# Patient Record
Sex: Female | Born: 1950 | Race: White | Hispanic: No | Marital: Married | Smoking: Never smoker
Health system: Southern US, Community
[De-identification: ages and names within clinical notes are randomized; demographics above are authoritative.]

## PROBLEM LIST (undated history)

## (undated) DIAGNOSIS — I10 Essential (primary) hypertension: Secondary | ICD-10-CM

## (undated) DIAGNOSIS — Z9221 Personal history of antineoplastic chemotherapy: Secondary | ICD-10-CM

## (undated) DIAGNOSIS — C4490 Unspecified malignant neoplasm of skin, unspecified: Secondary | ICD-10-CM

## (undated) DIAGNOSIS — T7840XA Allergy, unspecified, initial encounter: Secondary | ICD-10-CM

## (undated) DIAGNOSIS — R112 Nausea with vomiting, unspecified: Secondary | ICD-10-CM

## (undated) DIAGNOSIS — K219 Gastro-esophageal reflux disease without esophagitis: Secondary | ICD-10-CM

## (undated) DIAGNOSIS — G43909 Migraine, unspecified, not intractable, without status migrainosus: Secondary | ICD-10-CM

## (undated) DIAGNOSIS — C801 Malignant (primary) neoplasm, unspecified: Secondary | ICD-10-CM

## (undated) DIAGNOSIS — Z9889 Other specified postprocedural states: Secondary | ICD-10-CM

## (undated) DIAGNOSIS — C50919 Malignant neoplasm of unspecified site of unspecified female breast: Secondary | ICD-10-CM

## (undated) DIAGNOSIS — E785 Hyperlipidemia, unspecified: Secondary | ICD-10-CM

## (undated) HISTORY — DX: Allergy, unspecified, initial encounter: T78.40XA

## (undated) HISTORY — DX: Migraine, unspecified, not intractable, without status migrainosus: G43.909

## (undated) HISTORY — DX: Malignant (primary) neoplasm, unspecified: C80.1

## (undated) HISTORY — DX: Essential (primary) hypertension: I10

## (undated) HISTORY — DX: Hyperlipidemia, unspecified: E78.5

## (undated) HISTORY — PX: BREAST LUMPECTOMY WITH RADIOACTIVE SEED AND AXILLARY LYMPH NODE DISSECTION: SHX6656

---

## 1999-03-27 ENCOUNTER — Other Ambulatory Visit: Admission: RE | Admit: 1999-03-27 | Discharge: 1999-03-27 | Payer: Self-pay | Admitting: Radiology

## 1999-03-27 ENCOUNTER — Encounter (INDEPENDENT_AMBULATORY_CARE_PROVIDER_SITE_OTHER): Payer: Self-pay

## 2006-04-16 HISTORY — PX: MASTECTOMY MODIFIED RADICAL: SUR848

## 2007-01-15 ENCOUNTER — Ambulatory Visit: Payer: Self-pay | Admitting: Internal Medicine

## 2007-01-24 ENCOUNTER — Ambulatory Visit: Payer: Self-pay | Admitting: Oncology

## 2007-01-29 ENCOUNTER — Encounter: Admission: RE | Admit: 2007-01-29 | Discharge: 2007-01-29 | Payer: Self-pay | Admitting: Surgery

## 2007-02-03 ENCOUNTER — Ambulatory Visit (HOSPITAL_BASED_OUTPATIENT_CLINIC_OR_DEPARTMENT_OTHER): Admission: RE | Admit: 2007-02-03 | Discharge: 2007-02-04 | Payer: Self-pay | Admitting: Surgery

## 2007-02-03 ENCOUNTER — Encounter (INDEPENDENT_AMBULATORY_CARE_PROVIDER_SITE_OTHER): Payer: Self-pay | Admitting: Surgery

## 2007-02-07 ENCOUNTER — Ambulatory Visit: Payer: Self-pay | Admitting: Radiation Oncology

## 2007-02-15 ENCOUNTER — Ambulatory Visit: Payer: Self-pay | Admitting: Internal Medicine

## 2007-02-15 ENCOUNTER — Ambulatory Visit: Payer: Self-pay | Admitting: Radiation Oncology

## 2007-02-25 ENCOUNTER — Ambulatory Visit (HOSPITAL_BASED_OUTPATIENT_CLINIC_OR_DEPARTMENT_OTHER): Admission: RE | Admit: 2007-02-25 | Discharge: 2007-02-26 | Payer: Self-pay | Admitting: Surgery

## 2007-02-25 ENCOUNTER — Encounter (INDEPENDENT_AMBULATORY_CARE_PROVIDER_SITE_OTHER): Payer: Self-pay | Admitting: Surgery

## 2007-03-17 ENCOUNTER — Ambulatory Visit: Payer: Self-pay | Admitting: Radiation Oncology

## 2007-03-17 ENCOUNTER — Ambulatory Visit: Payer: Self-pay | Admitting: Internal Medicine

## 2007-04-17 ENCOUNTER — Ambulatory Visit: Payer: Self-pay | Admitting: Radiation Oncology

## 2007-04-17 ENCOUNTER — Ambulatory Visit: Payer: Self-pay | Admitting: Internal Medicine

## 2007-04-17 HISTORY — PX: COLONOSCOPY: SHX174

## 2007-05-18 ENCOUNTER — Ambulatory Visit: Payer: Self-pay | Admitting: Radiation Oncology

## 2007-05-18 ENCOUNTER — Ambulatory Visit: Payer: Self-pay | Admitting: Internal Medicine

## 2007-06-15 ENCOUNTER — Ambulatory Visit: Payer: Self-pay | Admitting: Internal Medicine

## 2007-06-15 ENCOUNTER — Ambulatory Visit: Payer: Self-pay | Admitting: Radiation Oncology

## 2007-07-16 ENCOUNTER — Ambulatory Visit: Payer: Self-pay | Admitting: Internal Medicine

## 2007-07-16 ENCOUNTER — Ambulatory Visit: Payer: Self-pay | Admitting: Radiation Oncology

## 2007-08-15 ENCOUNTER — Ambulatory Visit: Payer: Self-pay | Admitting: Radiation Oncology

## 2007-08-15 ENCOUNTER — Ambulatory Visit: Payer: Self-pay | Admitting: Internal Medicine

## 2007-09-15 ENCOUNTER — Ambulatory Visit: Payer: Self-pay | Admitting: Radiation Oncology

## 2007-09-15 ENCOUNTER — Ambulatory Visit: Payer: Self-pay | Admitting: Internal Medicine

## 2007-10-15 ENCOUNTER — Ambulatory Visit: Payer: Self-pay | Admitting: Radiation Oncology

## 2007-10-15 ENCOUNTER — Ambulatory Visit: Payer: Self-pay | Admitting: Internal Medicine

## 2007-11-15 ENCOUNTER — Ambulatory Visit: Payer: Self-pay | Admitting: Radiation Oncology

## 2007-11-15 ENCOUNTER — Ambulatory Visit: Payer: Self-pay | Admitting: Internal Medicine

## 2007-12-16 ENCOUNTER — Ambulatory Visit: Payer: Self-pay | Admitting: Internal Medicine

## 2007-12-16 ENCOUNTER — Ambulatory Visit: Payer: Self-pay | Admitting: Radiation Oncology

## 2008-01-15 ENCOUNTER — Ambulatory Visit: Payer: Self-pay | Admitting: Internal Medicine

## 2008-01-15 ENCOUNTER — Ambulatory Visit: Payer: Self-pay | Admitting: Radiation Oncology

## 2008-01-21 ENCOUNTER — Ambulatory Visit: Payer: Self-pay | Admitting: Internal Medicine

## 2008-02-15 ENCOUNTER — Ambulatory Visit: Payer: Self-pay | Admitting: Internal Medicine

## 2008-02-15 ENCOUNTER — Ambulatory Visit: Payer: Self-pay | Admitting: Radiation Oncology

## 2008-02-19 ENCOUNTER — Encounter: Admission: RE | Admit: 2008-02-19 | Discharge: 2008-02-19 | Payer: Self-pay | Admitting: Surgery

## 2008-02-25 ENCOUNTER — Encounter (INDEPENDENT_AMBULATORY_CARE_PROVIDER_SITE_OTHER): Payer: Self-pay | Admitting: Diagnostic Radiology

## 2008-02-25 ENCOUNTER — Encounter: Admission: RE | Admit: 2008-02-25 | Discharge: 2008-02-25 | Payer: Self-pay | Admitting: Surgery

## 2008-03-10 ENCOUNTER — Ambulatory Visit: Payer: Self-pay | Admitting: Unknown Physician Specialty

## 2008-03-16 ENCOUNTER — Ambulatory Visit: Payer: Self-pay | Admitting: Radiation Oncology

## 2008-04-16 ENCOUNTER — Ambulatory Visit: Payer: Self-pay | Admitting: Internal Medicine

## 2008-04-16 ENCOUNTER — Ambulatory Visit: Payer: Self-pay | Admitting: Radiation Oncology

## 2008-05-17 ENCOUNTER — Ambulatory Visit: Payer: Self-pay | Admitting: Radiation Oncology

## 2008-05-17 ENCOUNTER — Ambulatory Visit: Payer: Self-pay | Admitting: Internal Medicine

## 2008-06-14 ENCOUNTER — Ambulatory Visit: Payer: Self-pay | Admitting: Radiation Oncology

## 2008-07-15 ENCOUNTER — Ambulatory Visit: Payer: Self-pay | Admitting: Radiation Oncology

## 2008-08-14 ENCOUNTER — Ambulatory Visit: Payer: Self-pay | Admitting: Radiation Oncology

## 2008-08-19 ENCOUNTER — Encounter: Admission: RE | Admit: 2008-08-19 | Discharge: 2008-08-19 | Payer: Self-pay | Admitting: Surgery

## 2008-09-14 ENCOUNTER — Ambulatory Visit: Payer: Self-pay | Admitting: Radiation Oncology

## 2008-09-14 ENCOUNTER — Ambulatory Visit: Payer: Self-pay | Admitting: Internal Medicine

## 2008-09-23 ENCOUNTER — Ambulatory Visit (HOSPITAL_BASED_OUTPATIENT_CLINIC_OR_DEPARTMENT_OTHER): Admission: RE | Admit: 2008-09-23 | Discharge: 2008-09-23 | Payer: Self-pay | Admitting: Surgery

## 2008-12-13 ENCOUNTER — Ambulatory Visit: Payer: Self-pay | Admitting: Oncology

## 2008-12-15 ENCOUNTER — Ambulatory Visit: Payer: Self-pay | Admitting: Internal Medicine

## 2009-01-14 ENCOUNTER — Ambulatory Visit: Payer: Self-pay | Admitting: Internal Medicine

## 2009-07-15 ENCOUNTER — Ambulatory Visit: Payer: Self-pay | Admitting: Internal Medicine

## 2009-07-27 ENCOUNTER — Ambulatory Visit: Payer: Self-pay | Admitting: Internal Medicine

## 2009-07-28 ENCOUNTER — Ambulatory Visit: Payer: Self-pay | Admitting: Family Medicine

## 2009-08-14 ENCOUNTER — Ambulatory Visit: Payer: Self-pay | Admitting: Internal Medicine

## 2009-08-22 ENCOUNTER — Encounter: Admission: RE | Admit: 2009-08-22 | Discharge: 2009-08-22 | Payer: Self-pay | Admitting: Surgery

## 2009-12-15 ENCOUNTER — Ambulatory Visit: Payer: Self-pay | Admitting: Internal Medicine

## 2010-01-11 ENCOUNTER — Ambulatory Visit: Payer: Self-pay | Admitting: Internal Medicine

## 2010-01-12 LAB — CANCER ANTIGEN 27.29: CA 27.29: 21.4 U/mL (ref 0.0–38.6)

## 2010-01-14 ENCOUNTER — Ambulatory Visit: Payer: Self-pay | Admitting: Internal Medicine

## 2010-07-12 ENCOUNTER — Ambulatory Visit: Payer: Self-pay | Admitting: Internal Medicine

## 2010-07-13 LAB — CANCER ANTIGEN 27.29: CA 27.29: 25.2 U/mL (ref 0.0–38.6)

## 2010-07-16 ENCOUNTER — Ambulatory Visit: Payer: Self-pay | Admitting: Internal Medicine

## 2010-07-20 ENCOUNTER — Other Ambulatory Visit: Payer: Self-pay | Admitting: Surgery

## 2010-07-20 DIAGNOSIS — Z9010 Acquired absence of unspecified breast and nipple: Secondary | ICD-10-CM

## 2010-08-24 ENCOUNTER — Ambulatory Visit
Admission: RE | Admit: 2010-08-24 | Discharge: 2010-08-24 | Disposition: A | Discharge status: Home / Self Care | Payer: BC Managed Care – PPO | Source: Ambulatory Visit | Attending: Surgery | Admitting: Surgery

## 2010-08-24 DIAGNOSIS — Z9010 Acquired absence of unspecified breast and nipple: Secondary | ICD-10-CM

## 2010-08-29 NOTE — Op Note (Signed)
NAMESHALEY, LEAVENS              ACCOUNT NO.:  [id number]   MEDICAL RECORD NO.:  [id number]          PATIENT TYPE:  AMB   LOCATION:  DSC                          FACILITY:  [facility]   PHYSICIAN:  Currie Paris, M.D.DATE OF BIRTH:  12-07-1950   DATE OF PROCEDURE:  02/03/2007  DATE OF DISCHARGE:                               OPERATIVE REPORT   CCS [id number].   PREOPERATIVE DIAGNOSIS:  Right breast cancer, upper outer quadrant.   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   OPERATION:  Needle localized excision of right breast cancer with blue  dye injection and axillary lymph node dissection.   SURGEON:  Dr. Jamey Ripa   ANESTHESIA:  General.   CLINICAL HISTORY:  This is a 60 year old lady who several years ago had  a small focus of DCIS removed from the lateral high upper outer quadrant  of the right breast.  She had no further treatment.  She now presents  with a mass in the 12 to 11 o'clock position of the right breast which  seems away from the prior breast cancer, judging from clips previously  placed.  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  It was unclear how much of the abnormality was  cancer and how much represented fibrosis.  MRI confirmed the single  cancer, although it looked somewhat bigger on MRI.  The patient elected  to try lumpectomy, recognizing that if we could not get negative  margins, she may end up having to have a mastectomy.  We also planned to  the sentinel nerve node evaluation and possible node dissection.   DESCRIPTION OF PROCEDURE:  The patient seen in the holding area.  She  had no further questions.  The right breast was identified and marked as  the operative site.  I reviewed the preoperative mammograms.  She  already had been injected with her radioactive isotope.   The patient was taken to the operating room and after satisfactory  general anesthesia obtained, the time-out was done.  The right breast  was injected with 5 mL dilute methylene blue subareolarly and that was  massaged in.   The breast was then prepped and draped.  There was palpable abnormality  in the area of the hematoma and I did not know how much of this predated  the biopsy as the patient herself did not think she had palpated  anything previous to the biopsy but noted something afterwards.  Nevertheless, the area was fairly close to the skin, so I made an  elliptical incision to take that area of skin.  I then raised fairly  thin flaps going fairly far superior, medial inferior to the areolar  margin and then lateral to the guidewire and down essentially to the  chest wall, although there may have been a little bit of millimeters of  fatty tissue left behind on the chest wall.  Grossly I was well around  the tumor and this was sent for mammography as well as touch preps.  While waiting  for this to come back I used Neoprobe, identified a hot  area, made a transverse axillary incision.  I divided subcu tissues and  found a blue lymphatic leading to a blue lymph node which was hot with  counts about 200 and this was removed.  A little further dissection  showed another blue lymph node that was likewise removed with counts  about 400.  With a second node removed, there was no other hot areas, no  blue areas and no palpable adenopathy.   Both incisions were checked for hemostasis.  I closed the axillary  incision first with 3-0 Vicryl, 4-0 Monocryl subcuticular.  I turned my  attention back to the breast and checked the margins.  Everything  appeared to be dry.  I injected Marcaine and then closed with 3-0 Vicryl  to close the subcu and 4-0 Monocryl on the skin.   A few minutes later pathology reported that one sentinel node was  positive and one negative.  Radiology reported that the clip and the  specimen seemed to be well contained in the  lumpectomy specimen.   Since we had planned a node dissection if her node was positive, I went  ahead and reopened the incision and enlarged it.  I divided the actually  tissue down to the chest wall a the level of the incision, then opened  the clavipectoral fascia until I could identify the area of the axillary  vein and stripped the axillary contents from medial to lateral and  superior to inferior.  The second intercostal nerve was taken.  The  nerve supply to the pectoralis was left intact as was the blood supply.  The long thoracic and thoracodorsal nerves were preserved.  The  intervening tissue was dissected out.  I found one other node that  appeared to be positive at least grossly.   Once this was all completed I irrigated to make sure everything was dry.  I then placed a 19 Blake drain and closed the incision in layers with 3-  0 Vicryl, 4-0 Monocryl subcuticular and some Dermabond on the skin.   The patient tolerated procedure well.  There were no operative  complications.  All counts were correct.      Currie Paris, M.D.  Electronically Signed     CJS/MEDQ  D:  02/03/2007  T:  02/04/2007  Job:  [id number]   cc:   Reita Chard

## 2010-08-29 NOTE — Op Note (Signed)
[name], [name]              ACCOUNT NO.:  [id number]   MEDICAL RECORD NO.:  [id number]          PATIENT TYPE:  AMB   LOCATION:  DSC                          FACILITY:  [facility]   PHYSICIAN:  Currie Paris, M.D.DATE OF BIRTH:  06-26-1950   DATE OF PROCEDURE:  02/25/2007  DATE OF DISCHARGE:                               OPERATIVE REPORT   OFFICE MEDICAL RECORD NUMBER CCS [phone].   PREOP DIAGNOSIS:  Carcinoma right breast status post lumpectomy (with  positive margin) and axillary dissection.   POSTOPERATIVE DIAGNOSIS:  Carcinoma right breast status post lumpectomy  (with positive margin) and axillary dissection.   OPERATION:  Right total mastectomy, Port-A-Cath placement.   SURGEON:  Currie Paris, M.D.   ANESTHESIA:  General.   CLINICAL HISTORY:  [name] is a 60 year old lady who underwent a  partial mastectomy and was found to have a positive margin.  She also  had a positive lymph node on sentinel node and underwent complete node  dissection.  The patient was then seen by radiation and medical  oncologist and initially we planned a re-excision of her lumpectomy  site, and placement of Port-A-Cath for chemo.  However, the patient  discussed this with a few other physicians and other individuals; and  came back and elected to have a right total mastectomy with the hope  that she would then avoid radiation; and also elected, at this point, to  not have a reconstruction.   DESCRIPTION OF PROCEDURE:  The patient was seen in the holding area, and  she had no further questions.  We confirmed right total mastectomy with  Port-A-Cath placement on the left side as the planned procedure.  The  right side was initialed as the operative side for the mastectomy.   The patient taken to the operating room, and after satisfactory general  anesthesia had been obtained the breast was prepped and draped and the  time-out performed.   I made an elliptical incision going well  above by about 1.5 cm the upper  outer quadrant scar so that we had the old lumpectomy scar well excised.  I went out laterally so I could excise the drain site from the prior  axillary dissection.  I made an inframammary incision as well trying to  leave enough skin that we could do closure, but not have excess skin.   A mastectomy was then done the usual fashion.  I raised skin flaps  superiorly to the area of the clavicle, medially to the sternum, and  then laterally into the area of the axillary skin fold where the prior  axillary dissection had been done.  I then raised an inferior flap going  to the inframammary fold, and then out laterally to the latissimus.  When I identified the latissimus I was able to expose the anterior edge  of that for the entire length of my exposure up to the axilla.   The breast was then removed from medial-to-lateral using cautery.  Bleeders were either tied with 3-0 Vicryl suture ligatures or  cauterized.  I opened the clavipectoral fascia at the edge,  and removed  the breast from the remaining anterior chest wall, and going through  some fairly woody inflammatory tissue from her recent surgery in the  axilla.   I irrigated and spent a long time making sure everything was completely  dry.  I then placed two 19-Blake drains and secured them with a 2-0  nylon.  While I was doing that, I waited a few more minutes and then  irrigated, and made a last check for hemostasis; and, again, everything  appeared dry.  I tacked the skin flaps down with some 3-0 Vicryl, and  then closed the incision with staples.  Sterile dressings were applied.   Using entire new prep, drape, instruments, gowns, gloves, etc. the left  anterior chest area was prepped and draped as a sterile field.  A second  time-out was performed confirming Port-A-Cath placement.  The left  infraclavicular area was used for access to the subclavian vein, and I  entered the vein on the initial  attempt, and the guidewire threaded  easily into superior vena cava confirmed by fluoroscopy.   A short anterior chest wall incision was made, and the subcutaneous  tissue elevated to produce a pocket for the reservoir.  The tubing was  then brought from the reservoir site into the guidewire site.  The  guidewire site was dilated once with a dilator and a peel-away sheath,  and the dilator and guidewire were removed.  The catheter threaded into  about 20 cm and aspirated and irrigated easily.   Using fluoro I backed this up so that I was at about 17 cm and appeared  to be in the mid superior vena cava below the bifurcation of the  trachea, and above the atrial.  The reservoir was then flushed,  attached, and a locking mechanism engaged.  This aspirated and flushed  easily.  Using Prolene suture, I sutured the reservoir to the fascia.  We used a power port type of reservoir.  A final check was made with  fluoro, and we had good positioning with no kinks.  There is no evidence  pneumothorax.  The catheter reservoir was aspirated, flushed with  dilute, and then concentrated aqueous heparin.   The incision was closed with 3-0 Vicryl, 4-0 Monocryl subcuticular, and  Dermabond.  The patient and procedure well, and there no complications.  All counts were correct.      Currie Paris, M.D.  Electronically Signed     CJS/MEDQ  D:  02/25/2007  T:  02/26/2007  Job:  [id number]   cc:   Reita Chard

## 2010-08-29 NOTE — Op Note (Signed)
[name], [name]              ACCOUNT NO.:  [id number]   MEDICAL RECORD NO.:  [id number]          PATIENT TYPE:  AMB   LOCATION:  DSC                          FACILITY:  [facility]   PHYSICIAN:  Currie Paris, M.D.DATE OF BIRTH:  July 05, 1950   DATE OF PROCEDURE:  09/23/2008  DATE OF DISCHARGE:                               OPERATIVE REPORT   PREOPERATIVE DIAGNOSIS:  Unneeded Port-a-Cath.   POSTOPERATIVE DIAGNOSIS:  Unneeded Port-a-Cath.   OPERATION:  Port-a-Cath removal.   SURGEON:  Currie Paris, MD   ANESTHESIA:  Local.   CLINICAL HISTORY:  [name] has completed all of her chemotherapy  treatments for her breast cancer and wished to have her port removed.   DESCRIPTION OF PROCEDURE:  The patient was seen in the holding area and  had no further questions.  The area of the port was anesthetized with 1%  Xylocaine with epi.  I waited 10 minutes and then the area was prepped  with Betadine and draped.  The old scar was opened and the port  identified.  The holding sutures of Prolene were cut and removed.  The  tubing was backed a little bit out and a 4-0 Vicryl figure-of-eight was  used to occlude the tract as the catheter tubing was pulled back.  The  port was removed from the site.   Everything appeared to be dry.  The incision was closed in layers with 3-  0 Vicryl 4-0 Monocryl subcuticular and Dermabond.   The patient tolerated the procedure well and there were no  complications.      Currie Paris, M.D.  Electronically Signed     CJS/MEDQ  D:  09/23/2008  T:  09/23/2008  Job:  [id number]

## 2010-09-29 ENCOUNTER — Encounter (INDEPENDENT_AMBULATORY_CARE_PROVIDER_SITE_OTHER): Payer: Self-pay | Admitting: Surgery

## 2011-01-10 ENCOUNTER — Ambulatory Visit: Payer: Self-pay | Admitting: Internal Medicine

## 2011-01-11 LAB — CANCER ANTIGEN 27.29: CA 27.29: 24.5 U/mL (ref 0.0–38.6)

## 2011-01-15 ENCOUNTER — Ambulatory Visit: Payer: Self-pay | Admitting: Internal Medicine

## 2011-01-23 LAB — POCT HEMOGLOBIN-HEMACUE: Operator id: 128471

## 2011-01-23 LAB — BASIC METABOLIC PANEL
BUN: 9
Chloride: 98
Creatinine, Ser: 0.67
Glucose, Bld: 108 — ABNORMAL HIGH
Potassium: 3.3 — ABNORMAL LOW

## 2011-01-24 LAB — I-STAT 8, (EC8 V) (CONVERTED LAB)
Acid-Base Excess: 4 — ABNORMAL HIGH
BUN: 6
Bicarbonate: 29.8 — ABNORMAL HIGH
Glucose, Bld: 98
Operator id: 112821
Potassium: 3.4 — ABNORMAL LOW
Sodium: 138
pH, Ven: 7.386 — ABNORMAL HIGH

## 2011-01-24 LAB — BASIC METABOLIC PANEL
BUN: 6
Calcium: 9.5
GFR calc non Af Amer: >60
Glucose, Bld: 104 — ABNORMAL HIGH
Potassium: 3.3 — ABNORMAL LOW
Sodium: 138

## 2011-01-24 LAB — POCT HEMOGLOBIN-HEMACUE: Hemoglobin: 15.2 — ABNORMAL HIGH

## 2011-03-15 ENCOUNTER — Other Ambulatory Visit (INDEPENDENT_AMBULATORY_CARE_PROVIDER_SITE_OTHER): Payer: Self-pay | Admitting: Surgery

## 2011-03-15 DIAGNOSIS — Z1231 Encounter for screening mammogram for malignant neoplasm of breast: Secondary | ICD-10-CM

## 2011-03-15 DIAGNOSIS — Z9011 Acquired absence of right breast and nipple: Secondary | ICD-10-CM

## 2011-08-27 ENCOUNTER — Ambulatory Visit
Admission: RE | Admit: 2011-08-27 | Discharge: 2011-08-27 | Disposition: A | Discharge status: Home / Self Care | Payer: BC Managed Care – PPO | Source: Ambulatory Visit | Attending: Surgery | Admitting: Surgery

## 2011-08-27 DIAGNOSIS — Z1231 Encounter for screening mammogram for malignant neoplasm of breast: Secondary | ICD-10-CM

## 2011-08-27 DIAGNOSIS — Z9011 Acquired absence of right breast and nipple: Secondary | ICD-10-CM

## 2012-01-11 ENCOUNTER — Ambulatory Visit: Payer: Self-pay | Admitting: Internal Medicine

## 2012-01-11 LAB — CBC CANCER CENTER
Basophil #: 0.1 x10 3/mm (ref 0.0–0.1)
Basophil %: 2.3 %
Eosinophil %: 8.2 %
HCT: 39.2 % (ref 35.0–47.0)
HGB: 12.8 g/dL (ref 12.0–16.0)
Lymphocyte #: 1.4 x10 3/mm (ref 1.0–3.6)
Lymphocyte %: 38.3 %
MCH: 30.5 pg (ref 26.0–34.0)
Monocyte %: 8.8 %
Neutrophil #: 1.6 x10 3/mm (ref 1.4–6.5)
RBC: 4.19 10*6/uL (ref 3.80–5.20)

## 2012-01-11 LAB — HEPATIC FUNCTION PANEL A (ARMC)
Alkaline Phosphatase: 74 U/L (ref 50–136)
Bilirubin,Total: 0.6 mg/dL (ref 0.2–1.0)
SGOT(AST): 19 U/L (ref 15–37)
SGPT (ALT): 23 U/L (ref 12–78)

## 2012-01-11 LAB — CREATININE, SERUM
Creatinine: 0.79 mg/dL (ref 0.60–1.30)
EGFR (African American): >60

## 2012-01-15 ENCOUNTER — Ambulatory Visit: Payer: Self-pay | Admitting: Internal Medicine

## 2012-02-08 LAB — HEPATIC FUNCTION PANEL
Alkaline Phosphatase: 56 U/L (ref 25–125)
Bilirubin, Total: 0.9 mg/dL

## 2012-02-08 LAB — BASIC METABOLIC PANEL
BUN: 9 mg/dL (ref 4–21)
Glucose: 97 mg/dL
Potassium: 3.6 mmol/L (ref 3.4–5.3)

## 2012-02-08 LAB — CBC AND DIFFERENTIAL
Hemoglobin: 13.3 g/dL (ref 12.0–16.0)
Platelets: 370 10*3/uL (ref 150–399)
WBC: 3.8 10^3/mL

## 2012-02-08 LAB — TSH: TSH: 2.6 u[IU]/mL (ref <=5.90)

## 2012-02-08 LAB — LIPID PANEL: Triglycerides: 84 mg/dL (ref 40–160)

## 2012-02-13 ENCOUNTER — Ambulatory Visit: Payer: Self-pay | Admitting: Family Medicine

## 2012-03-16 HISTORY — PX: ABDOMINAL HYSTERECTOMY: SHX81

## 2012-05-08 ENCOUNTER — Ambulatory Visit: Payer: Self-pay | Admitting: Obstetrics and Gynecology

## 2012-05-08 DIAGNOSIS — I10 Essential (primary) hypertension: Secondary | ICD-10-CM

## 2012-05-08 LAB — BASIC METABOLIC PANEL
BUN: 10 mg/dL (ref 7–18)
Calcium, Total: 8.8 mg/dL (ref 8.5–10.1)
Chloride: 103 mmol/L (ref 98–107)
Co2: 32 mmol/L (ref 21–32)
Creatinine: 0.75 mg/dL (ref 0.60–1.30)
EGFR (African American): >60
EGFR (Non-African Amer.): >60
Osmolality: 275 (ref 275–301)
Potassium: 3.3 mmol/L — ABNORMAL LOW (ref 3.5–5.1)
Sodium: 138 mmol/L (ref 136–145)

## 2012-05-08 LAB — CBC
MCHC: 34.3 g/dL (ref 32.0–36.0)
Platelet: 322 10*3/uL (ref 150–440)
RDW: 13 % (ref 11.5–14.5)
WBC: 5 10*3/uL (ref 3.6–11.0)

## 2012-05-15 ENCOUNTER — Ambulatory Visit: Payer: Self-pay | Admitting: Obstetrics and Gynecology

## 2012-05-16 LAB — BASIC METABOLIC PANEL
Anion Gap: 7 (ref 7–16)
BUN: 8 mg/dL (ref 7–18)
Calcium, Total: 8.3 mg/dL — ABNORMAL LOW (ref 8.5–10.1)
Chloride: 102 mmol/L (ref 98–107)
Co2: 29 mmol/L (ref 21–32)
Creatinine: 0.64 mg/dL (ref 0.60–1.30)
EGFR (Non-African Amer.): >60
Osmolality: 275 (ref 275–301)

## 2012-05-16 LAB — HEMATOCRIT: HCT: 33.2 % — ABNORMAL LOW (ref 35.0–47.0)

## 2012-07-15 ENCOUNTER — Other Ambulatory Visit: Payer: Self-pay

## 2012-07-15 DIAGNOSIS — Z9011 Acquired absence of right breast and nipple: Secondary | ICD-10-CM

## 2012-07-15 DIAGNOSIS — Z1231 Encounter for screening mammogram for malignant neoplasm of breast: Secondary | ICD-10-CM

## 2012-08-11 ENCOUNTER — Ambulatory Visit (INDEPENDENT_AMBULATORY_CARE_PROVIDER_SITE_OTHER): Payer: BC Managed Care – PPO | Admitting: Family Medicine

## 2012-08-11 ENCOUNTER — Encounter: Payer: Self-pay | Admitting: Family Medicine

## 2012-08-11 ENCOUNTER — Ambulatory Visit: Payer: Self-pay | Admitting: Family Medicine

## 2012-08-11 VITALS — BP 142/92 | HR 75 | Temp 99.0°F | Resp 16 | Ht 70.5 in | Wt 168.0 lb

## 2012-08-11 DIAGNOSIS — E7800 Pure hypercholesterolemia, unspecified: Secondary | ICD-10-CM

## 2012-08-11 DIAGNOSIS — I10 Essential (primary) hypertension: Secondary | ICD-10-CM

## 2012-08-11 DIAGNOSIS — J309 Allergic rhinitis, unspecified: Secondary | ICD-10-CM

## 2012-08-11 DIAGNOSIS — M7989 Other specified soft tissue disorders: Secondary | ICD-10-CM

## 2012-08-11 DIAGNOSIS — E876 Hypokalemia: Secondary | ICD-10-CM

## 2012-08-11 LAB — CBC WITH DIFFERENTIAL/PLATELET
Basophils Absolute: 0.1 10*3/uL (ref 0.0–0.1)
Basophils Relative: 1 % (ref 0–1)
HCT: 39.4 % (ref 36.0–46.0)
Hemoglobin: 13.2 g/dL (ref 12.0–15.0)
Lymphocytes Relative: 27 % (ref 12–46)
Lymphs Abs: 1.3 10*3/uL (ref 0.7–4.0)
MCH: 30.1 pg (ref 26.0–34.0)
MCHC: 33.5 g/dL (ref 30.0–36.0)
RBC: 4.38 MIL/uL (ref 3.87–5.11)
RDW: 13.2 % (ref 11.5–15.5)
WBC: 4.7 10*3/uL (ref 4.0–10.5)

## 2012-08-11 LAB — COMPREHENSIVE METABOLIC PANEL
ALT: 12 U/L (ref 0–35)
AST: 13 U/L (ref 0–37)
Alkaline Phosphatase: 55 U/L (ref 39–117)
Creat: 0.7 mg/dL (ref 0.50–1.10)
Sodium: 138 mEq/L (ref 135–145)
Total Bilirubin: 0.6 mg/dL (ref 0.3–1.2)
Total Protein: 7.6 g/dL (ref 6.0–8.3)

## 2012-08-11 LAB — LIPID PANEL
Cholesterol: 214 mg/dL — ABNORMAL HIGH (ref 0–200)
LDL Cholesterol: 129 mg/dL — ABNORMAL HIGH (ref 0–99)
Total CHOL/HDL Ratio: 3.1 Ratio
VLDL: 15 mg/dL (ref 0–40)

## 2012-08-11 LAB — CK: Total CK: 67 U/L (ref 7–177)

## 2012-08-11 MED ORDER — SIMVASTATIN 20 MG PO TABS: 20.0000 mg | ORAL_TABLET | Freq: Every evening | ORAL | Status: DC

## 2012-08-11 MED ORDER — HYDROCHLOROTHIAZIDE 25 MG PO TABS: 25.0000 mg | ORAL_TABLET | Freq: Every day | ORAL | Status: DC

## 2012-08-11 MED ORDER — FLUTICASONE PROPIONATE 50 MCG/ACT NA SUSP: NASAL | Status: DC

## 2012-08-11 NOTE — Patient Instructions (Addendum)
1.  CALL IF FACIAL PAIN OR GLAND TENDERNESS WORSENS OR PERSISTS. 2.  I WILL CALL WITH ULTRASOUND RESULTS LATER TODAY.

## 2012-08-11 NOTE — Progress Notes (Signed)
[address]   [place], [place]  [address]   [phone]  Subjective:    Patient ID: [name], female    DOB: [birth year], 62 y.o.   MRN: [id number]  HPI This 62 y.o. female presents to establish care.    Last physical 01/2012 with Leim Fabry at [facility] in [place]. Pap smear 01/2012 by Dayna Barker. Mammogram due next month; annual mammograms in [place].   Colonoscopy 2008; repeat in ten years.  No polyps.  Mobile.  [place]. Flu vaccine 2013. Zostavax 2013. TDAP 2010. Eye exam 2013.  No glasses.  No glaucoma or cataracts. Dental exam every six months. Bone density scan scheduled for early Coda 2014.  At [facility].  1.  HTN:  Not checking blood pressure at home.  Reports compliance with medication; good tolerance to medication; good symptom control.  Has lost 30 pounds in past year intentionally.  2.  Hyperlipidemia:  Has lost weight; eating healthier.  Limiting desserts, cheeses.  Lost 198 to 168.  Now taking statin 1/2 every other day.  Good tolerance to medication; good symptom control.  3.  Uterine Prolapse: s/p hysterectomy in 04/2012 by [facility].  Had difficulties with anesthesia; vomiting.  Now suffering with intermittent inability to completely empty bladder.    4.  Hypokalemia: during hospitalization for hysterectomy; would like potassium repeated today.  Feels well.  5.  L posterior knee swelling:  Onset after hysterectomy. No associated knee pain.  +known varicose veins.  No calf pain or swelling.  Persistent without improvement.  No popping or giving out of knee. Walking up and down stairs a lot at daughter's house.  Also exercising some.    6.  Allergies:  Onset in past 1-2 weeks; +facial pain; no ear pain or sore throat; +rhinorrhea; +nasal congestion; +PND; no cough.  +glands tender.  No v/d.  No malaise or fatigue.  Low grade fever today.  No chills/sweats.  No medications for allergies.   Review of Systems  Constitutional: Negative for  fever, chills, diaphoresis and fatigue.  HENT: Positive for congestion, rhinorrhea, sneezing, postnasal drip and sinus pressure. Negative for ear pain, sore throat, trouble swallowing and voice change.   Respiratory: Negative for cough, shortness of breath, wheezing and stridor.   Cardiovascular: Positive for leg swelling. Negative for chest pain and palpitations.  Gastrointestinal: Negative for vomiting and diarrhea.  Genitourinary: Positive for decreased urine volume.  Musculoskeletal: Negative for myalgias, joint swelling and arthralgias.  Neurological: Negative for dizziness, tremors, seizures, syncope, facial asymmetry, speech difficulty, weakness, light-headedness, numbness and headaches.        Past Medical History  Diagnosis Date  . Allergy     Codeine  . Hypertension   . 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 Hyperlipidemia     Past Surgical History  Procedure Laterality Date  . Mastectomy modified radical  2008  . Abdominal hysterectomy  03/16/2012    uterine prolapse.  Ovaries resected.   [facility].    Prior to Admission medications   Medication Sig Start Date End Date Taking? Authorizing Provider  aspirin 81 MG tablet Take 81 mg by mouth daily.   Yes Historical Provider, MD  Cholecalciferol (VITAMIN D-3 PO) Take 1,000 Units by mouth daily.   Yes Historical Provider, MD  hydrochlorothiazide (HYDRODIURIL) 25 MG tablet Take 1 tablet (25 mg total) by mouth daily. 08/11/12  Yes Ethelda Chick, MD  letrozole [facility]) 2.5 MG tablet Take 2.5 mg by mouth daily.  Yes Historical Provider, MD  Multiple Vitamins-Calcium (VIACTIV MULTI-VITAMIN) CHEW Chew by mouth daily.   Yes Historical Provider, MD  OVER THE COUNTER MEDICATION    Yes Historical Provider, MD  simvastatin (ZOCOR) 20 MG tablet Take 1 tablet (20 mg total) by mouth every evening. 08/11/12  Yes Ethelda Chick, MD  Wheat Dextrin (BENEFIBER DRINK MIX PO) Take by mouth daily.   Yes Historical Provider, MD  fluticasone  (FLONASE) 50 MCG/ACT nasal spray 2 sprays into each nostril daily. 08/11/12   Ethelda Chick, MD    Allergies  Allergen Reactions  . Codeine Other (See Comments)    hallunication  . Eucalyptus Flavor [employer] systems [employer]) Other (See Comments)    Causes difficulty in breathing    History   Social History  . Marital Status: Married    Spouse Name: N/A    Number of Children: N/A  . Years of Education: N/A   Occupational History  . Not on file.   Social History Main Topics  . Smoking status: Never Smoker   . Smokeless tobacco: Not on file  . Alcohol Use: No  . Drug Use: Not on file  . Sexually Active: Not on file   Other Topics Concern  . Not on file   Social History Narrative   Marital status:  Married x 33 years; happily married; no abuse.      Children: one daughter (45); no grandchildren.      Lives: with daughter currently.       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      Tobacco:  Never.      Alcohol: none      Drugs: none      Exercise:  Job very physical; walking during the week.    Family History  Problem Relation Age of Onset  . Stroke Mother     TIAs in 62s.  . Hypertension Mother   . Heart disease Father   . Hypertension Father   . Drug abuse Sister     Objective:   Physical Exam  Nursing note and vitals reviewed. Constitutional: She is oriented to person, place, and time. She appears well-developed and well-nourished. No distress.  HENT:  Head: Normocephalic and atraumatic.  Right Ear: External ear normal.  Left Ear: External ear normal.  Nose: Mucosal edema and rhinorrhea present. Right sinus exhibits frontal sinus tenderness. Right sinus exhibits no maxillary sinus tenderness. Left sinus exhibits frontal sinus tenderness. Left sinus exhibits no maxillary sinus tenderness.  Mouth/Throat: Oropharynx is clear and moist.  Eyes: Conjunctivae are normal. Pupils are equal, round, and  reactive to light.  Neck: Normal range of motion. Neck supple. No thyromegaly present.  Cardiovascular: Normal rate, regular rhythm, normal heart sounds and intact distal pulses.  Exam reveals no gallop and no friction rub.   No murmur heard. +varicosities B calves without tenderness, warmth, induration.  Hommen's negative.  Pulmonary/Chest: Effort normal and breath sounds normal. She has no wheezes. She has no rales.  Musculoskeletal:       Left knee: She exhibits swelling. She exhibits normal range of motion, no ecchymosis, no deformity, no erythema, normal alignment, no bony tenderness, normal meniscus and no MCL laxity. No tenderness found. No medial joint line and no lateral joint line tenderness noted.  L KNEE: +posterior swelling knee 5cm x 6 cm; no erythema or warmth.  Non-tender; no fluctuants.  No effusion of anterior  knee.  Lymphadenopathy:    She has no cervical adenopathy.  Neurological: She is alert and oriented to person, place, and time.  Skin: Skin is warm and dry. No rash noted. She is not diaphoretic. No erythema. No pallor.  Psychiatric: She has a normal mood and affect. Her behavior is normal. Judgment and thought content normal.       Assessment & Plan:  Essential hypertension, benign - Plan: hydrochlorothiazide (HYDRODIURIL) 25 MG tablet  Pure hypercholesterolemia - Plan: CBC with Differential, CK, Comprehensive metabolic panel, Lipid panel, simvastatin (ZOCOR) 20 MG tablet  Hypokalemia - Plan: Comprehensive metabolic panel  Allergic rhinitis - Plan: fluticasone (FLONASE) 50 MCG/ACT nasal spray  Leg swelling - Plan: US Venous Img Lower Unilateral Left   1.  HTN: moderately controlled but first time to new office; no changes to management; obtain labs.  Refills provided. 2.  Hypercholesterolemia: controlled; no change in medications; obtain labs; refill provided. 3.  Allergic Rhinitis:  Uncontrolled; rx for Flonase provided.  If no improvement in one week, call  for antibiotic for secondary sinusitis.  4.  Leg swelling L posterior knee:  New. Onset post-operatively after hysterectomy.  Obtain STAT LE doppler to rule out DVT.   5.  Hypokalemia:  New.  Repeat labs today; secondary to HCTZ use most likely.     Meds ordered this encounter  Medications  . OVER THE COUNTER MEDICATION    Sig:   . Multiple Vitamins-Calcium (VIACTIV MULTI-VITAMIN) CHEW    Sig: Chew by mouth daily.  . Cholecalciferol (VITAMIN D-3 PO)    Sig: Take 1,000 Units by mouth daily.  Marland Kitchen aspirin 81 MG tablet    Sig: Take 81 mg by mouth daily.  Marland Kitchen DISCONTD: hydrochlorothiazide (HYDRODIURIL) 25 MG tablet    Sig: Take 25 mg by mouth daily.  Marland Kitchen letrozole (FEMARA) 2.5 MG tablet    Sig: Take 2.5 mg by mouth daily.  Marland Kitchen DISCONTD: simvastatin (ZOCOR) 20 MG tablet    Sig: Take 20 mg by mouth every evening.  . Wheat Dextrin (BENEFIBER DRINK MIX PO)    Sig: Take by mouth daily.  . simvastatin (ZOCOR) 20 MG tablet    Sig: Take 1 tablet (20 mg total) by mouth every evening.    Dispense:  90 tablet    Refill:  3  . hydrochlorothiazide (HYDRODIURIL) 25 MG tablet    Sig: Take 1 tablet (25 mg total) by mouth daily.    Dispense:  90 tablet    Refill:  3  . fluticasone (FLONASE) 50 MCG/ACT nasal spray    Sig: 2 sprays into each nostril daily.    Dispense:  16 g    Refill:  6

## 2012-08-12 ENCOUNTER — Telehealth: Payer: Self-pay | Admitting: Radiology

## 2012-08-12 ENCOUNTER — Encounter: Payer: Self-pay | Admitting: Family Medicine

## 2012-08-12 DIAGNOSIS — M7122 Synovial cyst of popliteal space [Baker], left knee: Secondary | ICD-10-CM

## 2012-08-12 NOTE — Telephone Encounter (Signed)
Spoke to patient, she states she had [place] and was told there was not a clot. She was advised it was a cyst. Please advise, she wants to know what treatment plan is.

## 2012-08-13 MED ORDER — MELOXICAM 15 MG PO TABS: 15.0000 mg | ORAL_TABLET | Freq: Every day | ORAL | Status: DC

## 2012-08-13 NOTE — Telephone Encounter (Signed)
Called patient to advise on the message from Dr Katrinka Blazing. She is advised to use the Meloxicam for 1 month and to rest after work. To use ice after work, for at least 15 minutes. If she is improved after 6month of treatment she may cancel her appt. She understands this. And agrees to plan.

## 2012-08-13 NOTE — Telephone Encounter (Signed)
Scheduling --- please schedule pt a follow-up appointment with me in one month.

## 2012-08-13 NOTE — Telephone Encounter (Signed)
I called pt to schedule appt but she had not been called my clinical yet. I read her the note from Dr. Katrinka Blazing. She works 5 days a week, 4 hours a day on her feet at [employer]. When I mentioned the "rest" recommendation she is concerned how that will fit into her work schedule. Should she not work or should she take frequent breaks?

## 2012-08-13 NOTE — Telephone Encounter (Signed)
Please call --- Lower extremity doppler of leg leg shows a Baker's cyst; no blood clot seen.   A Baker's cyst is a cyst behind the knee and usually develops due to chronic inflammation in the knee.  Recommend an anti-inflammatory scheduled for the next 2-4 weeks, rest, ice area for 15-20 minutes every night.  I will send in prescription for Mobic to take daily for next month.  If no improvement in one month, recommend follow-up visit with me.  I will have scheduling call her with an appointment in one month; if area of swelling improves, she can cancel appointment.

## 2012-08-14 NOTE — Telephone Encounter (Signed)
Follow up appt made for 09/12/12.

## 2012-08-26 ENCOUNTER — Encounter: Payer: Self-pay | Admitting: *Deleted

## 2012-08-27 ENCOUNTER — Ambulatory Visit
Admission: RE | Admit: 2012-08-27 | Discharge: 2012-08-27 | Disposition: A | Discharge status: Home / Self Care | Payer: BC Managed Care – PPO | Source: Ambulatory Visit

## 2012-08-27 DIAGNOSIS — Z9011 Acquired absence of right breast and nipple: Secondary | ICD-10-CM

## 2012-08-27 DIAGNOSIS — Z1231 Encounter for screening mammogram for malignant neoplasm of breast: Secondary | ICD-10-CM

## 2012-09-10 ENCOUNTER — Encounter: Payer: Self-pay | Admitting: Family Medicine

## 2012-09-12 ENCOUNTER — Ambulatory Visit (INDEPENDENT_AMBULATORY_CARE_PROVIDER_SITE_OTHER): Payer: BC Managed Care – PPO | Admitting: Family Medicine

## 2012-09-12 ENCOUNTER — Encounter: Payer: Self-pay | Admitting: Family Medicine

## 2012-09-12 ENCOUNTER — Ambulatory Visit: Payer: BC Managed Care – PPO

## 2012-09-12 VITALS — BP 157/90 | HR 73 | Temp 98.7°F | Resp 16 | Ht 70.5 in | Wt 174.0 lb

## 2012-09-12 DIAGNOSIS — M7122 Synovial cyst of popliteal space [Baker], left knee: Secondary | ICD-10-CM

## 2012-09-12 DIAGNOSIS — I10 Essential (primary) hypertension: Secondary | ICD-10-CM

## 2012-09-12 DIAGNOSIS — E7800 Pure hypercholesterolemia, unspecified: Secondary | ICD-10-CM

## 2012-09-12 DIAGNOSIS — J309 Allergic rhinitis, unspecified: Secondary | ICD-10-CM

## 2012-09-12 DIAGNOSIS — M7120 Synovial cyst of popliteal space [Baker], unspecified knee: Secondary | ICD-10-CM | POA: Insufficient documentation

## 2012-09-12 NOTE — Patient Instructions (Addendum)
1.  STOP MELOXICAM. 2.  CHECK BLOOD PRESSURE EVERY OTHER DAY; CALL IF BP REMAINS>140/90. 3.  RESUME YOUR NORMAL ACTIVITY LEVEL; YOU DO NOT NEED TO LIMIT ACTIVITY DUE TO KNEE SWELLING.   4.  CALL OFFICE IF L KNEE SWELLING WORSENS OR IF YOU DEVELOP PAIN IN KNEE.   Baker's Cyst A Baker's cyst is a swelling that forms in the back of the knee. It is a sac-like structure. It is filled with the same fluid that is located in your knee. The fluid located in your knee is necessary because it lubricates the bones and cartilage. It allows them to move over each other more easily. CAUSES  When the knee becomes injured or has soreness (inflammation) present, more fluid forms in the knee. When this happens, the joint lining is pushed out behind the knee and forms the baker's cyst. This cyst may also be caused by inflammation from arthritic conditions and infections. DIAGNOSIS  A Baker's cyst is most often diagnosed with an ultrasound. This is a specialized picture (like an X-ray). It shows a picture by using sound waves. Sometimes a specialized x-ray called an MRI (magnetic resonance imaging) is used. This picks up other problems within a joint if an ultrasound alone cannot make the diagnosis. If the cyst came immediately following an injury, plain x-rays may be used to make a diagnosis. TREATMENT  The treatment depends on the cause of the cyst. But most of these cysts are caused by an inflammation. Anti-inflammatory medications and rest often will get rid of the problem. If the cyst is caused by an infection, medications (antibiotics) will be prescribed to help this. Take the medications as directed. Refer to Home Care Instructions, below, for additional treatment suggestions. HOME CARE INSTRUCTIONS   If the cyst was caused by an injury, for the first 24 hours, while lying down, keep the injured extremity elevated on 2 pillows.  For the first 24 hours while you are awake, apply ice bags (ice in a plastic bag  with a towel around it to prevent frostbite to skin) 3-4 times per day for 15-20 minutes to the injured area. Then do as directed by your caregiver.  Only take over-the-counter or prescription medicines for pain, discomfort, or fever as directed by your caregiver. Persistent pain and inability to use the injured area for more than 2 to 3 days are warning signs indicating that you should see a caregiver for a follow-up visit as soon as possible. Persistent pain and swelling indicate that further evaluation, non-weight bearing (use of crutches as instructed), and/or further x-rays are needed. Make a follow-up appointment with your own caregiver. If conservative measures (rest, medications and inactivity) do not help the problem get better, sometimes surgery for removal of the cyst is needed. Reasons for this may be that the cyst is pressing on nerves and/or vessels and causing problems which cannot wait for improvement with conservative treatment. If the problem is caused by injuries to the cartilage in the knee, surgery is often needed for treatment of that problem. MAKE SURE YOU:   Understand these instructions.  Will watch your condition.  Will get help right away if you are not doing well or get worse. Document Released: 04/02/2005 Document Revised: 06/25/2011 Document Reviewed: 11/19/2007 [facility] Patient Information 2014 [place], [place].

## 2012-09-12 NOTE — Progress Notes (Signed)
[address]   [place], [place]  [address]   [phone]  Subjective:    Patient ID: [name], female    DOB: Apr 02, 1951, 62 y.o.   MRN: [id number]  HPI This 62 y.o. female presents for evaluation of the following:  1. L posterior knee swelling: one month follow-up.  S/p LE doppler negative for DVT but +Baker's cyst.  Rx for Mobic prescribed.  No knee xray obtained at last visit.  Taking Meloxicam daily since last visit.  Think swelling down some.   No pain at all. Has been going up and down stairs at daughter's house for past several months.  No pain in knee.  No giving out; +popping of knee L.  Icing daily.  2.  Allergic rhinitis:  Improved after last visit; no headaches.  Nasal congestion much improved.  3.  HTN:  Not checking BP at home.  Did drink coffee this morning. Has gained a little weight; went on trip.  Has also been taking Meloxicam for the past month for L knee swelling.      4.  Hypercholesterolemia: uncontrolled at visit last month; advised to increase statin to 1/2 daily.  Tolerating increased dose without side effects.  5.  Hypokalemia: normal K level of 3.9 at last visit one month ago.  Review of Systems  Constitutional: Negative for fever, chills, diaphoresis and fatigue.  HENT: Negative for ear pain, congestion, sore throat, rhinorrhea, sneezing, trouble swallowing and postnasal drip.   Respiratory: Negative for cough and shortness of breath.   Cardiovascular: Negative for chest pain, palpitations and leg swelling.  Musculoskeletal: Positive for joint swelling. Negative for myalgias, back pain, arthralgias and gait problem.  Skin: Negative for color change.  Neurological: Negative for dizziness, syncope, weakness, light-headedness, numbness and headaches.   Past Medical History  Diagnosis Date  . Hypertension   . 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 Hyperlipidemia   . Allergy     seasonal   Past Surgical History  Procedure Laterality Date  .  Mastectomy modified radical  2008  . Abdominal hysterectomy  03/16/2012    uterine prolapse.  Ovaries resected.   [facility].   Current Outpatient Prescriptions on File Prior to Visit  Medication Sig Dispense Refill  . aspirin 81 MG tablet Take 81 mg by mouth daily.      . Cholecalciferol (VITAMIN D-3 PO) Take 1,000 Units by mouth daily.      . fluticasone (FLONASE) 50 MCG/ACT nasal spray 2 sprays into each nostril daily.  16 g  6  . hydrochlorothiazide (HYDRODIURIL) 25 MG tablet Take 1 tablet (25 mg total) by mouth daily.  90 tablet  3  . letrozole (FEMARA) 2.5 MG tablet Take 2.5 mg by mouth daily.      . meloxicam (MOBIC) 15 MG tablet Take 1 tablet (15 mg total) by mouth daily.  30 tablet  1  . Multiple Vitamins-Calcium (VIACTIV MULTI-VITAMIN) CHEW Chew by mouth daily.      Marland Kitchen OVER THE COUNTER MEDICATION       . simvastatin (ZOCOR) 20 MG tablet Take 1 tablet (20 mg total) by mouth every evening.  90 tablet  3  . Wheat Dextrin (BENEFIBER DRINK MIX PO) Take by mouth daily.       No current facility-administered medications on file prior to visit.   History   Social History  . Marital Status: Married    Spouse Name: N/A    Number of Children:  N/A  . Years of Education: N/A   Occupational History  . Not on file.   Social History Main Topics  . Smoking status: Never Smoker   . Smokeless tobacco: Not on file  . Alcohol Use: No  . Drug Use: Not on file  . Sexually Active: Not on file   Other Topics Concern  . Not on file   Social History Narrative   Marital status:  Married x 33 years; happily married; no abuse.      Children: one daughter (58); no grandchildren.      Lives: with daughter currently.       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      Tobacco:  Never.      Alcohol: none      Drugs: none      Exercise:  Job very physical; walking during the week.       Objective:   Physical Exam    Nursing note and vitals reviewed. Constitutional: She appears well-developed and well-nourished. No distress.  HENT:  Head: Normocephalic and atraumatic.  Right Ear: External ear normal.  Left Ear: External ear normal.  Nose: Nose normal.  Mouth/Throat: Oropharynx is clear and moist.  Eyes: Conjunctivae and EOM are normal. Pupils are equal, round, and reactive to light.  Neck: Normal range of motion. Neck supple. No JVD present. No thyromegaly present.  Cardiovascular: Normal rate, regular rhythm, normal heart sounds and intact distal pulses.  Exam reveals no gallop and no friction rub.   No murmur heard. Pulmonary/Chest: Effort normal and breath sounds normal. She has no wheezes. She has no rales.  Musculoskeletal:       Left knee: She exhibits swelling. She exhibits normal range of motion, no effusion, no ecchymosis, no deformity, no laceration, no erythema, no bony tenderness, normal meniscus and no MCL laxity. No tenderness found. No medial joint line, no lateral joint line, no MCL, no LCL and no patellar tendon tenderness noted.  L KNEE: MILD SWELLING POSTERIOR KNEE; 50% IMPROVEMENT IN SWELLING FROM LAST VISIT; NON-TENDER ALONG JOINT LINES; FULL EXTENSION/FLEXION; MCMURRAY'S NEGATIVE; LACHMAN'S NEGATIVE; ANTERIOR DRAWER NEGATIVE; V/V STRAIN INTACT. NORMAL GAIT.  Lymphadenopathy:    She has no cervical adenopathy.  Skin: Skin is warm and dry. No rash noted. She is not diaphoretic. No erythema.  Psychiatric: She has a normal mood and affect. Her behavior is normal.     [facility] reading (PRIMARY) by  Dr. Katrinka Blazing.  L KNEE: MILD MEDIAL NARROWING/ARTHRITIC CHANGES; SPURRING.   Assessment & Plan:

## 2012-09-12 NOTE — Assessment & Plan Note (Signed)
Uncontrolled; tolerating increase to 1/2 tablet daily.  Repeat labs next visit.

## 2012-09-12 NOTE — Assessment & Plan Note (Signed)
Worsening.  Stop Mobic; monitor BP at home for next month; call if BP remains > 140/90.  If remains elevated, consider adding Lisinopril.

## 2012-09-12 NOTE — Assessment & Plan Note (Signed)
New.  Improved swelling with Mobic, icing, rest.  Obtain L knee xray; asymptomatic currently; advised to stop Mobic and icing. If swelling worsens or if develops pain in knee, will warrant ortho referral.

## 2012-09-12 NOTE — Assessment & Plan Note (Signed)
Improved no change in management 

## 2012-09-16 ENCOUNTER — Ambulatory Visit: Payer: Self-pay | Admitting: Internal Medicine

## 2012-09-29 ENCOUNTER — Telehealth: Payer: Self-pay

## 2012-09-29 DIAGNOSIS — I10 Essential (primary) hypertension: Secondary | ICD-10-CM

## 2012-09-29 NOTE — Telephone Encounter (Signed)
PT STATES DR Katrinka Blazing WANTED HER TO CHECK HER BP AND SHE WANTED TO SPEAK WITH HER ABOUT IT. TOLD PT I COULD TAKE THE NUMBERS DOWN, BUT SHE DIDN'T WANT ME TO PLEASE CALL [phone]

## 2012-09-30 MED ORDER — LISINOPRIL 10 MG PO TABS: 10.0000 mg | ORAL_TABLET | Freq: Every day | ORAL | Status: DC

## 2012-09-30 NOTE — Telephone Encounter (Signed)
Call --- 1.  I am also concerned with her elevated blood pressure readings.  I recommend addition a second blood pressure medication.  I will send in Lisinopril 10mg  one daily to pharmacy; this medication will take 3-4 weeks to take full effect.  She should continue taking Hydrochlorathiazide 25mg  one daily.  2.  Advise patient that high blood pressure frequently worsens with age, even with weight loss.  3.  The hysterectomy in 04/2012 should not be contributing to her anxiety since she was already menopausal at time of hysterectomy.  4.  I would like to see her 4-8 weeks after starting Lisinopril; I will have a schedule call her for an appointment.

## 2012-09-30 NOTE — Telephone Encounter (Signed)
Dr Katrinka Blazing, She is really concerned about her elevated BP readings. She says the systolic #'s are not going below 140, they are staying in the 140s and 150s. Her diastolic #s range in 90s and 100s. She is very anxious, worried, depressed with the situation because she has lost weight --- just feeling discouraged. With the high BP she has developed HA and is just not feeling well. (also wanted to remind you she can not take beta blockers- as they make her feel very terrible). She had a hysterectomy at the end of Jan 2014, and does not know if this could be causing her issues (in regards to the anxiety/depression feeling) Please advise. Thank you!

## 2012-09-30 NOTE — Telephone Encounter (Signed)
lmom to cb. 

## 2012-09-30 NOTE — Telephone Encounter (Signed)
Pt notified of notes

## 2012-10-01 NOTE — Telephone Encounter (Signed)
Left msg for pt to schedule 4-8 week f-up appt with Dr. Katrinka Blazing.

## 2012-10-02 NOTE — Telephone Encounter (Signed)
Pt made follow up appt for 11/10/12.

## 2012-11-10 ENCOUNTER — Encounter: Payer: Self-pay | Admitting: Family Medicine

## 2012-11-10 ENCOUNTER — Ambulatory Visit (INDEPENDENT_AMBULATORY_CARE_PROVIDER_SITE_OTHER): Payer: BC Managed Care – PPO | Admitting: Family Medicine

## 2012-11-10 VITALS — BP 140/86 | HR 77 | Temp 98.6°F | Resp 16

## 2012-11-10 DIAGNOSIS — M7122 Synovial cyst of popliteal space [Baker], left knee: Secondary | ICD-10-CM

## 2012-11-10 DIAGNOSIS — M7120 Synovial cyst of popliteal space [Baker], unspecified knee: Secondary | ICD-10-CM

## 2012-11-10 DIAGNOSIS — I10 Essential (primary) hypertension: Secondary | ICD-10-CM

## 2012-11-10 MED ORDER — LISINOPRIL 20 MG PO TABS: 10.0000 mg | ORAL_TABLET | Freq: Every day | ORAL | Status: DC

## 2012-11-10 NOTE — Progress Notes (Signed)
[address]   [place], [place]  [address]   [phone]  Subjective:    Patient ID: [name], female    DOB: 08-01-1950, 62 y.o.   MRN: [id number]  HPI This 62 y.o. female presents for one month follow-up for HTN. After last visit, added Lisinopril 10mg  daily; has continued HCTZ 25mg  daily.  Not checking BP at home; feels better; less anxious; has moved into own home since last visit; no longer living with daughter.  Denies HA, dizziness, vision changes, chest pain, palpitations, SOB, leg swelling.  Worried about persistently elevated readings today but admits to getting stressed out by drive to office.  Walking daily and helping with stress management.  2. L Baker's cyst: swelling has greatly improved since last visit.  No pain; no giving out or popping of knee.    Review of Systems  Constitutional: Negative for fever, chills, diaphoresis and fatigue.  Respiratory: Negative for shortness of breath, wheezing and stridor.   Cardiovascular: Negative for chest pain, palpitations and leg swelling.  Musculoskeletal: Negative for myalgias, joint swelling and arthralgias.  Neurological: Negative for dizziness, tremors, seizures, syncope, facial asymmetry, speech difficulty, weakness, light-headedness, numbness and headaches.  Psychiatric/Behavioral: Positive for sleep disturbance. The patient is nervous/anxious.     Past Medical History  Diagnosis Date  . Hypertension   . 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 Hyperlipidemia   . Allergy     seasonal    Past Surgical History  Procedure Laterality Date  . Mastectomy modified radical  2008  . Abdominal hysterectomy  03/16/2012    uterine prolapse.  Ovaries resected.   [facility].    Prior to Admission medications   Medication Sig Start Date End Date Taking? Authorizing Provider  aspirin 81 MG tablet Take 81 mg by mouth daily.    Historical Provider, MD  Cholecalciferol (VITAMIN D-3 PO) Take 1,000 Units by mouth daily.     Historical Provider, MD  fluticasone (FLONASE) 50 MCG/ACT nasal spray 2 sprays into each nostril daily. 08/11/12   Ethelda Chick, MD  hydrochlorothiazide (HYDRODIURIL) 25 MG tablet Take 1 tablet (25 mg total) by mouth daily. 08/11/12   Ethelda Chick, MD  letrozole [facility]) 2.5 MG tablet Take 2.5 mg by mouth daily.    Historical Provider, MD  lisinopril (PRINIVIL,ZESTRIL) 10 MG tablet Take 1 tablet (10 mg total) by mouth daily. 09/30/12   Ethelda Chick, MD  meloxicam (MOBIC) 15 MG tablet Take 1 tablet (15 mg total) by mouth daily. 08/13/12   Ethelda Chick, MD  Multiple Vitamins-Calcium (VIACTIV MULTI-VITAMIN) CHEW Chew by mouth daily.    Historical Provider, MD  OVER THE COUNTER MEDICATION     Historical Provider, MD  simvastatin (ZOCOR) 20 MG tablet Take 1 tablet (20 mg total) by mouth every evening. 08/11/12   Ethelda Chick, MD  Wheat Dextrin (BENEFIBER DRINK MIX PO) Take by mouth daily.    Historical Provider, MD    Allergies  Allergen Reactions  . Codeine Other (See Comments)    hallunication  . Eucalyptus Flavor Information systems manager) Other (See Comments)    Causes difficulty in breathing    History   Social History  . Marital Status: Married    Spouse Name: N/A    Number of Children: N/A  . Years of Education: N/A   Occupational History  . Not on file.   Social History Main Topics  . Smoking status: Never Smoker   .  Smokeless tobacco: Not on file  . Alcohol Use: No  . Drug Use: Not on file  . Sexually Active: Not on file   Other Topics Concern  . Not on file   Social History Narrative   Marital status:  Married x 33 years; happily married; no abuse.      Children: one daughter (22); no grandchildren.      Lives: with daughter currently.       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      Tobacco:  Never.      Alcohol: none      Drugs: none      Exercise:  Job very physical; walking during the week.     Family History  Problem Relation Age of Onset  . Stroke Mother     TIAs in 51s.  . Hypertension Mother   . Heart disease Father   . Hypertension Father   . Drug abuse Sister        Objective:   Physical Exam  Nursing note and vitals reviewed. Constitutional: She is oriented to person, place, and time. She appears well-developed and well-nourished. No distress.  HENT:  Head: Normocephalic and atraumatic.  Mouth/Throat: Oropharynx is clear and moist.  Eyes: Conjunctivae and EOM are normal. Pupils are equal, round, and reactive to light.  Neck: Normal range of motion. Neck supple. No JVD present. No thyromegaly present.  Cardiovascular: Normal rate, regular rhythm, normal heart sounds and intact distal pulses.  Exam reveals no gallop and no friction rub.   No murmur heard. Pulmonary/Chest: Effort normal and breath sounds normal. She has no wheezes. She has no rales.  Lymphadenopathy:    She has no cervical adenopathy.  Neurological: She is alert and oriented to person, place, and time. No cranial nerve deficit. She exhibits normal muscle tone. Coordination normal.  Skin: Skin is warm and dry. She is not diaphoretic.  Psychiatric: She has a normal mood and affect. Her behavior is normal.        Assessment & Plan:  Baker's cyst of knee, left  Essential hypertension, benign   1.  HTN: improved but persistently elevated; increase Lisinopril to 20mg  daily; continue HCTZ 25mg  daily.  Follow-up in three months. 2.  Baker's cyst L knee:improving.  Asymptomatic at this time; swelling has improved.

## 2012-12-08 ENCOUNTER — Telehealth: Payer: Self-pay

## 2012-12-08 NOTE — Telephone Encounter (Signed)
Called her After last visit, increased Lisinopril 20 mg daily; has continued HCTZ 25mg  daily. 105/66 it makes her dizzy, and drowsy. Has taken 1/2 a tablet of Lisinopril  past 2 days and feels better, please advise.

## 2012-12-08 NOTE — Telephone Encounter (Signed)
Thanks patient advised.  

## 2012-12-08 NOTE — Telephone Encounter (Signed)
Patient would like to talk to a nurse about her blood pressure meds please call her at [phone]

## 2012-12-08 NOTE — Telephone Encounter (Signed)
Recommend continuing Lisinopril 20mg  1/2 tablet daily; recommend checking BP daily.

## 2012-12-20 ENCOUNTER — Ambulatory Visit (INDEPENDENT_AMBULATORY_CARE_PROVIDER_SITE_OTHER): Payer: BC Managed Care – PPO | Admitting: Family Medicine

## 2012-12-20 VITALS — BP 122/84 | HR 76 | Temp 98.9°F | Resp 16 | Ht 70.0 in | Wt 172.4 lb

## 2012-12-20 DIAGNOSIS — J309 Allergic rhinitis, unspecified: Secondary | ICD-10-CM

## 2012-12-20 DIAGNOSIS — I10 Essential (primary) hypertension: Secondary | ICD-10-CM

## 2012-12-20 DIAGNOSIS — H6981 Other specified disorders of Eustachian tube, right ear: Secondary | ICD-10-CM

## 2012-12-20 DIAGNOSIS — M7120 Synovial cyst of popliteal space [Baker], unspecified knee: Secondary | ICD-10-CM

## 2012-12-20 DIAGNOSIS — H6980 Other specified disorders of Eustachian tube, unspecified ear: Secondary | ICD-10-CM

## 2012-12-20 DIAGNOSIS — M7122 Synovial cyst of popliteal space [Baker], left knee: Secondary | ICD-10-CM

## 2012-12-20 MED ORDER — PREDNISONE 10 MG PO TABS: ORAL_TABLET | ORAL | Status: DC

## 2012-12-20 MED ORDER — MECLIZINE HCL 25 MG PO TABS: 25.0000 mg | ORAL_TABLET | Freq: Three times a day (TID) | ORAL | Status: DC | PRN

## 2012-12-20 MED ORDER — CETIRIZINE HCL 10 MG PO TABS: 10.0000 mg | ORAL_TABLET | Freq: Every day | ORAL | Status: DC

## 2012-12-20 MED ORDER — ONDANSETRON 4 MG PO TBDP
4.0000 mg | ORAL_TABLET | Freq: Once | ORAL | Status: AC
  Administered 2012-12-20: 4 mg via ORAL

## 2012-12-20 NOTE — Patient Instructions (Addendum)
Take the fluticasone and cetirizine at night for 2 weeks minimum but I recommend at a month, take the prednisone pills all together in the morning.   Hot showers or breathing in steam may help loosen the congestion.  Using a netti pot or sinus rinse is also likely to help you feel better and keep this from progressing.  Use the meclizine pills as needed throughout the day just to treat the dizzy symptoms. If no improvement or you are getting worse, come back as you might need to start certain exercises to move a stone in the middle ear or do additional evaluation.  Serous Otitis Media  Serous otitis media is also known as otitis media with effusion (OME). It means there is fluid in the middle ear space. This space contains the bones for hearing and air. Air in the middle ear space helps to transmit sound.  The air gets there through the eustachian tube. This tube goes from the back of the throat to the middle ear space. It keeps the pressure in the middle ear the same as the outside world. It also helps to drain fluid from the middle ear space. CAUSES  OME occurs when the eustachian tube gets blocked. Blockage can come from:  Ear infections.  Colds and other upper respiratory infections.  Allergies.  Irritants such as cigarette smoke.  Sudden changes in air pressure (such as descending in an airplane).  Enlarged adenoids. During colds and upper respiratory infections, the middle ear space can become temporarily filled with fluid. This can happen after an ear infection also. Once the infection clears, the fluid will generally drain out of the ear through the eustachian tube. If it does not, then OME occurs. SYMPTOMS   Hearing loss.  A feeling of fullness in the ear  but no pain.  Young children may not show any symptoms. DIAGNOSIS   Diagnosis of OME is made by an ear exam.  Tests may be done to check on the movement of the eardrum.  Hearing exams may be done. TREATMENT   The fluid  most often goes away without treatment.  If allergy is the cause, allergy treatment may be helpful.  Fluid that persists for several months may require minor surgery. A small tube is placed in the ear drum to:  Drain the fluid.  Restore the air in the middle ear space.  In certain situations, antibiotics are used to avoid surgery.  Surgery may be done to remove enlarged adenoids (if this is the cause). HOME CARE INSTRUCTIONS   Keep children away from tobacco smoke.  Be sure to keep follow up appointments, if any. SEEK MEDICAL CARE IF:   Hearing is not better in 3 months.  Hearing is worse.  Ear pain.  Drainage from the ear.  Dizziness. Document Released: 06/23/2003 Document Revised: 06/25/2011 Document Reviewed: 04/22/2008 [facility] Patient Information 2014 [place], [place].

## 2012-12-20 NOTE — Progress Notes (Signed)
Subjective:    Patient ID: [name], female    DOB: [birth year], 62 y.o.   MRN: [id number] Chief Complaint  Patient presents with  . Dizziness    started this morning, didnt take BP pills ; feels off balance   . Nausea    HPI  Woke up this morning and felt very off-balance and nauseated, had to hold on to things to walk.  No vertigo symptoms - room not spinning or moving.  Felt presyncopal.  Is improving a little, balance improving but dizziness still painful, wonders if she is having some sinus pressure though no congestion, vision is not as good as usual. Started on lisinopril a few wks ago - was on 10mg  a mo ago but wasn't strong enough so it as doubled but felt a little dizziness with that initially - off balance then as well so did not take lisinopril this morning as worried that was the cause of sxs. Took her hctz an hr ago while in the waiting room but no other medications. No abd pain or diarrhea. No fever/chills/sweats but did feel like she was trying to fight off something or might be getting sick.  No known sick contacts.  No ear symptoms.   Past Medical History  Diagnosis Date  . Hypertension   . 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 Hyperlipidemia   . Allergy     seasonal    Current Outpatient Prescriptions on File Prior to Visit  Medication Sig Dispense Refill  . aspirin 81 MG tablet Take 81 mg by mouth daily.      . Cholecalciferol (VITAMIN D-3 PO) Take 1,000 Units by mouth daily.      . fluticasone (FLONASE) 50 MCG/ACT nasal spray 2 sprays into each nostril daily.  16 g  6  . hydrochlorothiazide (HYDRODIURIL) 25 MG tablet Take 1 tablet (25 mg total) by mouth daily.  90 tablet  3  . letrozole (FEMARA) 2.5 MG tablet Take 2.5 mg by mouth daily.      Marland Kitchen lisinopril (PRINIVIL,ZESTRIL) 20 MG tablet Take 0.5 tablets (10 mg total) by mouth daily.  30 tablet  5  . meloxicam (MOBIC) 15 MG tablet Take 1 tablet (15 mg total) by mouth daily.  30 tablet  1  . Multiple  Vitamins-Calcium (VIACTIV MULTI-VITAMIN) CHEW Chew by mouth daily.      Marland Kitchen OVER THE COUNTER MEDICATION       . simvastatin (ZOCOR) 20 MG tablet Take 1 tablet (20 mg total) by mouth every evening.  90 tablet  3  . Wheat Dextrin (BENEFIBER DRINK MIX PO) Take by mouth daily.       No current facility-administered medications on file prior to visit.   Allergies  Allergen Reactions  . Codeine Other (See Comments)    hallunication  . Eucalyptus Flavor [Flavoring Agent] Other (See Comments)    Causes difficulty in breathing     Review of Systems  Constitutional: Positive for fatigue. Negative for fever, chills, diaphoresis, activity change and appetite change.  HENT: Positive for sore throat and sinus pressure. Negative for hearing loss, ear pain, nosebleeds, congestion, rhinorrhea, sneezing, trouble swallowing, neck pain, neck stiffness, voice change, postnasal drip and ear discharge.   Eyes: Positive for visual disturbance. Negative for discharge and itching.  Respiratory: Negative for cough and shortness of breath.   Cardiovascular: Negative for chest pain.  Gastrointestinal: Positive for nausea. Negative for vomiting, abdominal pain, diarrhea, constipation, blood in stool and anal bleeding.  Musculoskeletal: Positive for joint swelling, arthralgias and gait problem.  Skin: Negative for rash.  Neurological: Positive for dizziness, weakness, light-headedness and headaches. Negative for syncope.  Hematological: Negative for adenopathy.  Psychiatric/Behavioral: Negative for sleep disturbance.      BP 142/90  Pulse 80  Temp(Src) 98.9 F (37.2 C) (Oral)  Resp 16  Ht 5\' 10"  (1.778 m)  Wt 172 lb 6.4 oz (78.2 kg)  BMI 24.74 kg/m2  SpO2 97% Objective:   Physical Exam  Constitutional: She is oriented to person, place, and time. She appears well-developed and well-nourished. She appears listless.  HENT:  Head: Normocephalic and atraumatic.  Right Ear: External ear and ear canal normal. A  middle ear effusion is present.  Left Ear: Tympanic membrane, external ear and ear canal normal.  Nose: Mucosal edema present. No rhinorrhea. Right sinus exhibits no maxillary sinus tenderness and no frontal sinus tenderness. Left sinus exhibits no maxillary sinus tenderness and no frontal sinus tenderness.  Mouth/Throat: Uvula is midline, oropharynx is clear and moist and mucous membranes are normal. No oropharyngeal exudate.  Eyes: Conjunctivae and EOM are normal. Pupils are equal, round, and reactive to light.  Neck: Normal range of motion. Neck supple. No thyromegaly present.  Cardiovascular: Normal rate, regular rhythm, normal heart sounds and intact distal pulses.   Pulmonary/Chest: Effort normal and breath sounds normal. No respiratory distress.  Neurological: She is oriented to person, place, and time. She has normal strength. She appears listless. No cranial nerve deficit or sensory deficit. Coordination and gait normal.  + dix-hallpike on right by symptoms, no nystagmus  Skin: Skin is warm and dry. She is not diaphoretic.  Psychiatric: She has a normal mood and affect. Her behavior is normal.     orthostatic VS negative Assessment & Plan:  Essential hypertension, benign - do not think pt's sxs are due to her lisinopril - encouraged pt to restart lisinopril and cont hctz.  Eustachian tube dysfunction, right - Plan: ondansetron (ZOFRAN-ODT) disintegrating tablet 4 mg SL x 1 given in office due to nausea sxs.  I think pt's dizziness is due to a right serous otitis so start prednisone and continue zyrtec, steam treatment/sinus rinse or netti pot/nasal saline spray/humidifier - see pt instructions. RTC for further eval if continues.  Baker's cyst of knee, left - pt wanted evaluated today but states it is improving so further examination and treatment deferred due to more pressing concerns above.  Allergic rhinitis, cause unspecified  Meds ordered this encounter  Medications  .  predniSONE (DELTASONE) 10 MG tablet    Sig: 6 tabs po d1, 5 tabs po d2, 4 tabs po d3, 3 tabs po d4, 2 tabs po d5, 1 tab po d6    Dispense:  21 tablet    Refill:  0  . cetirizine (ZYRTEC) 10 MG tablet    Sig: Take 1 tablet (10 mg total) by mouth daily.    Dispense:  30 tablet    Refill:  1  . meclizine (ANTIVERT) 25 MG tablet    Sig: Take 1 tablet (25 mg total) by mouth 3 (three) times daily as needed for dizziness.    Dispense:  30 tablet    Refill:  0  . ondansetron (ZOFRAN-ODT) disintegrating tablet 4 mg    Sig:

## 2013-01-13 ENCOUNTER — Ambulatory Visit: Payer: Self-pay | Admitting: Internal Medicine

## 2013-01-13 LAB — CBC CANCER CENTER
Basophil #: 0.1 x10 3/mm (ref 0.0–0.1)
Basophil %: 2 %
Eosinophil #: 0.3 x10 3/mm (ref 0.0–0.7)
Eosinophil %: 5.7 %
HCT: 39.8 % (ref 35.0–47.0)
HGB: 13.3 g/dL (ref 12.0–16.0)
MCH: 30.7 pg (ref 26.0–34.0)
MCHC: 33.4 g/dL (ref 32.0–36.0)
MCV: 92 fL (ref 80–100)
Monocyte #: 0.5 x10 3/mm (ref 0.2–0.9)
Neutrophil #: 2.6 x10 3/mm (ref 1.4–6.5)
Neutrophil %: 50.9 %
RBC: 4.32 10*6/uL (ref 3.80–5.20)

## 2013-01-13 LAB — CREATININE, SERUM
Creatinine: 0.77 mg/dL (ref 0.60–1.30)
EGFR (Non-African Amer.): >60

## 2013-01-14 ENCOUNTER — Ambulatory Visit: Payer: Self-pay | Admitting: Internal Medicine

## 2013-01-14 LAB — CANCER ANTIGEN 27.29: CA 27.29: 24.9 U/mL (ref 0.0–38.6)

## 2013-02-24 ENCOUNTER — Encounter: Payer: Self-pay | Admitting: Family Medicine

## 2013-02-24 ENCOUNTER — Ambulatory Visit (INDEPENDENT_AMBULATORY_CARE_PROVIDER_SITE_OTHER): Payer: BC Managed Care – PPO | Admitting: Family Medicine

## 2013-02-24 VITALS — BP 120/80 | HR 88 | Temp 97.5°F | Resp 16 | Ht 70.0 in | Wt 172.0 lb

## 2013-02-24 DIAGNOSIS — C50911 Malignant neoplasm of unspecified site of right female breast: Secondary | ICD-10-CM

## 2013-02-24 DIAGNOSIS — K5900 Constipation, unspecified: Secondary | ICD-10-CM

## 2013-02-24 DIAGNOSIS — Z23 Encounter for immunization: Secondary | ICD-10-CM | POA: Insufficient documentation

## 2013-02-24 DIAGNOSIS — I10 Essential (primary) hypertension: Secondary | ICD-10-CM

## 2013-02-24 DIAGNOSIS — E7800 Pure hypercholesterolemia, unspecified: Secondary | ICD-10-CM

## 2013-02-24 DIAGNOSIS — Z0000 Encounter for general adult medical examination without abnormal findings: Secondary | ICD-10-CM | POA: Insufficient documentation

## 2013-02-24 DIAGNOSIS — J309 Allergic rhinitis, unspecified: Secondary | ICD-10-CM

## 2013-02-24 LAB — COMPREHENSIVE METABOLIC PANEL
AST: 20 U/L (ref 0–37)
Albumin: 4.9 g/dL (ref 3.5–5.2)
Alkaline Phosphatase: 57 U/L (ref 39–117)
BUN: 6 mg/dL (ref 6–23)
Potassium: 4.1 mEq/L (ref 3.5–5.3)
Sodium: 138 mEq/L (ref 135–145)

## 2013-02-24 LAB — CBC WITH DIFFERENTIAL/PLATELET
Basophils Absolute: 0.1 10*3/uL (ref 0.0–0.1)
Eosinophils Relative: 5 % (ref 0–5)
Lymphocytes Relative: 29 % (ref 12–46)
Neutro Abs: 2.3 10*3/uL (ref 1.7–7.7)
Platelets: 415 10*3/uL — ABNORMAL HIGH (ref 150–400)
RDW: 13.3 % (ref 11.5–15.5)
WBC: 4.2 10*3/uL (ref 4.0–10.5)

## 2013-02-24 LAB — POCT UA - MICROSCOPIC ONLY
Casts, Ur, LPF, POC: NEGATIVE
Crystals, Ur, HPF, POC: NEGATIVE
Mucus, UA: NEGATIVE
RBC, urine, microscopic: NEGATIVE

## 2013-02-24 LAB — POCT URINALYSIS DIPSTICK
Bilirubin, UA: NEGATIVE
Blood, UA: NEGATIVE
Glucose, UA: NEGATIVE
Ketones, UA: NEGATIVE
Nitrite, UA: NEGATIVE
Spec Grav, UA: 1.01
pH, UA: 7

## 2013-02-24 LAB — LIPID PANEL
Cholesterol: 168 mg/dL (ref 0–200)
HDL: 60 mg/dL (ref >39)
Total CHOL/HDL Ratio: 2.8 Ratio
Triglycerides: 82 mg/dL (ref <150)
VLDL: 16 mg/dL (ref 0–40)

## 2013-02-24 MED ORDER — HYDROCHLOROTHIAZIDE 25 MG PO TABS: 25.0000 mg | ORAL_TABLET | Freq: Every day | ORAL | Status: DC

## 2013-02-24 MED ORDER — LISINOPRIL 20 MG PO TABS: 10.0000 mg | ORAL_TABLET | Freq: Every day | ORAL | Status: DC

## 2013-02-24 MED ORDER — FLUTICASONE PROPIONATE 50 MCG/ACT NA SUSP: NASAL | Status: DC

## 2013-02-24 MED ORDER — SIMVASTATIN 20 MG PO TABS: 20.0000 mg | ORAL_TABLET | Freq: Every evening | ORAL | Status: DC

## 2013-02-24 NOTE — Assessment & Plan Note (Signed)
Controlled; continue 1/2 Lisinopril 20mg  daily and HCTZ 25mg  daily; obtain labs; obtain u/a, EKG.

## 2013-02-24 NOTE — Progress Notes (Signed)
  Subjective:    Patient ID: [name], female    DOB: 01/27/1951, 62 y.o.   MRN: [id number]  HPI    Review of Systems  Constitutional: Negative.   HENT: Negative.   Eyes: Negative.   Respiratory: Negative.   Cardiovascular: Negative.   Gastrointestinal: Negative.   Endocrine: Negative.   Genitourinary: Negative.   Musculoskeletal: Negative.   Skin: Negative.   Allergic/Immunologic: Negative.   Neurological: Negative.   Hematological: Negative.   Psychiatric/Behavioral: Negative.        Objective:   Physical Exam        Assessment & Plan:

## 2013-02-24 NOTE — Assessment & Plan Note (Signed)
Worsening in the past three months; stop calcium supplementation; start vegetable/fruit shake daily.  Continue stool softener daily. Colonoscopy UTD.

## 2013-02-24 NOTE — Assessment & Plan Note (Signed)
Worsening; continue Flonase and nasal saline daily; restart Zyrtec 10mg  daily.

## 2013-02-24 NOTE — Assessment & Plan Note (Addendum)
Anticipatory guidance --- exercise, weight maintenance.  Pap smear UTD.  Mammogram 2014.  Colonoscopy 2008.  Immunizations UTD; s/p flu vaccine in office.  Obtain labs.

## 2013-02-24 NOTE — Assessment & Plan Note (Signed)
Stable; taking Simvastatin 20mg  1/2 tablet daily due to concerns of potential side effects to medication; obtain labs.  Refill provided.

## 2013-02-24 NOTE — Progress Notes (Signed)
[address]   [place], [place]  [address]   [phone]  Subjective:    Patient ID: [name], female    DOB: 1950/06/12, 62 y.o.   MRN: [id number]  HPI This 62 y.o. female presents for Complete Physical Examination.  Last physical 11/2011 [facility] in [place]. Pap smear 04/2012.  [facility]. Mammogram 08/27/12. Colonoscopy 2009; repeat in 10 years.  No polyps. TDAP  UTD. Zostavax 2013. Pneumovax never. Influenza vaccine requesting. Eye exam due; +readers; last eye exam 2012. Dental Exam every six months.  HTN: only taking 1/2 Lisinopril 20mg  daily; continues to take HCTZ 25mg  once daily.  Constipation:  Worsening in past month; was taking Benefiber which is now not working; chronic issue for patient.    Breast cancer: followed by oncology once per year; going to switch to [facility] oncology.  No longer seeing surgeon.  Pandit.  Hyperlipidemia: has been taking Simvastatin 20mg  1/2 tablet daily for past four months.   Review of Systems  Constitutional: Negative.   HENT: Negative.   Eyes: Negative.   Respiratory: Negative.   Cardiovascular: Negative.   Gastrointestinal: Negative.   Endocrine: Negative.   Genitourinary: Negative.   Musculoskeletal: Negative.   Skin: Negative.   Allergic/Immunologic: Negative.   Neurological: Negative.   Hematological: Negative.   Psychiatric/Behavioral: Negative.    Past Medical History  Diagnosis Date  . Hypertension   . 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 Hyperlipidemia   . Allergy     seasonal   Past Surgical History  Procedure Laterality Date  . Mastectomy modified radical  2008  . Abdominal hysterectomy  03/16/2012    uterine prolapse.  Ovaries resected.   [facility].  . Colonoscopy  04/17/2007    normal; repeat in 10 years.  [facility].   Allergies  Allergen Reactions  . Codeine Other (See Comments)    hallunication  . Eucalyptus Flavor [Flavoring Agent] Other (See Comments)    Causes  difficulty in breathing   Current Outpatient Prescriptions on File Prior to Visit  Medication Sig Dispense Refill  . aspirin 81 MG tablet Take 81 mg by mouth daily.      . Cholecalciferol (VITAMIN D-3 PO) Take 1,000 Units by mouth daily.      Marland Kitchen letrozole (FEMARA) 2.5 MG tablet Take 2.5 mg by mouth daily.      . Multiple Vitamins-Calcium (VIACTIV MULTI-VITAMIN) CHEW Chew by mouth daily.      Marland Kitchen OVER THE COUNTER MEDICATION       . cetirizine (ZYRTEC) 10 MG tablet Take 1 tablet (10 mg total) by mouth daily.  30 tablet  1   No current facility-administered medications on file prior to visit.   History   Social History  . Marital Status: Married    Spouse Name: N/A    Number of Children: N/A  . Years of Education: N/A   Occupational History  . Geologist, engineering    Social History Main Topics  . Smoking status: Never Smoker   . Smokeless tobacco: Not on file  . Alcohol Use: No  . Drug Use: Not on file  . Sexual Activity: Yes   Other Topics Concern  . Not on file   Social History Narrative   Marital status:  Married x 33 years; happily married; no abuse.      Children: one daughter (97) [name]; no grandchildren.      Lives: with husband in [place].      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      Tobacco:  Never.      Alcohol: none      Drugs: none      Exercise:  Job very physical; walking during the week.  Walking on walking track 3 days per week.      Seatbelt:  100%      Guns: loaded; unsecured.      Sunscreen:  None; rare sun exposure.   Family History  Problem Relation Age of Onset  . Stroke Mother     TIAs in 64s.  . Hypertension Mother   . Heart disease Father 71    AMI age 17  . Hypertension Father   . Drug abuse Sister   . Cancer Sister     CLL       Objective:   Physical Exam  Nursing note and vitals reviewed. Constitutional: She is oriented to person, place, and time. She appears well-developed  and well-nourished. No distress.  HENT:  Head: Normocephalic and atraumatic.  Right Ear: External ear normal.  Left Ear: External ear normal.  Nose: Nose normal.  Mouth/Throat: Oropharynx is clear and moist.  Eyes: Conjunctivae and EOM are normal. Pupils are equal, round, and reactive to light.  Neck: Normal range of motion. Neck supple. No JVD present. No thyromegaly present.  Cardiovascular: Normal rate, regular rhythm, normal heart sounds and intact distal pulses.  Exam reveals no gallop and no friction rub.   No murmur heard. Pulmonary/Chest: Effort normal and breath sounds normal. No respiratory distress. She has no wheezes. She has no rales.  Abdominal: Soft. Bowel sounds are normal. She exhibits no distension and no mass. There is no tenderness. There is no rebound and no guarding.  Genitourinary: No breast swelling, tenderness or discharge.  BREAST:  R MASTECTOMY WITH WELL HEALED SCAR; CHEST WALL WITHOUT MASS.  L BREAST WITHOUT MASSES, DRAINAGE.  Musculoskeletal:       Right shoulder: Normal.       Left shoulder: Normal.       Cervical back: Normal.  Lymphadenopathy:    She has no cervical adenopathy.  Neurological: She is alert and oriented to person, place, and time. She has normal reflexes. No cranial nerve deficit. She exhibits normal muscle tone. Coordination normal.  Skin: Skin is warm and dry. No rash noted. She is not diaphoretic.  Psychiatric: She has a normal mood and affect. Her behavior is normal. Judgment and thought content normal.    EKG:  NSR; no acute changes.  INFLUENZA VACCINE ADMINISTERED IN OFFICE.  Results for orders placed in visit on 02/24/13  POCT URINALYSIS DIPSTICK      Result Value Range   Color, UA yellow     Clarity, UA clear     Glucose, UA neg     Bilirubin, UA neg     Ketones, UA neg     Spec Grav, UA 1.010     Blood, UA neg     pH, UA 7.0     Protein, UA neg     Urobilinogen, UA 0.2     Nitrite, UA neg     Leukocytes, UA Trace      POCT UA - MICROSCOPIC ONLY      Result Value Range   WBC, Ur, HPF, POC 0-3     RBC, urine, microscopic neg     Bacteria, U Microscopic trace     Mucus,  UA neg     Epithelial cells, urine per micros 0-3     Crystals, Ur, HPF, POC neg     Casts, Ur, LPF, POC neg     Yeast, UA neg         Assessment & Plan:

## 2013-02-24 NOTE — Assessment & Plan Note (Signed)
Stable.  Followed annually by oncology; continue Femara.

## 2013-02-24 NOTE — Assessment & Plan Note (Signed)
Administered today.

## 2013-03-08 ENCOUNTER — Ambulatory Visit (INDEPENDENT_AMBULATORY_CARE_PROVIDER_SITE_OTHER): Payer: BC Managed Care – PPO | Admitting: Family Medicine

## 2013-03-08 VITALS — BP 138/76 | HR 90 | Temp 98.6°F | Resp 18 | Ht 70.25 in | Wt 171.2 lb

## 2013-03-08 DIAGNOSIS — N816 Rectocele: Secondary | ICD-10-CM

## 2013-03-08 NOTE — Progress Notes (Signed)
Subjective:    Patient ID: [name], female    DOB: 15-Dec-1950, 61 y.o.   MRN: [id number]  HPI This 62 y.o. female presents for possible prolapsed bladder.  Felt a little funny in genital region.  She feels something falling out of vaginal area intermittently.  Denies vaginal pain, discharge, or burning/itching. Denies dysuria,frequency, hematuria, nocturia, flank pain.  Denies n/v/d; denies fever/chills/sweats.  S/p hysterectomy 03/16/2012 for uterine prolapse; ovaries resected; performed by [facility].  Review of Systems  Constitutional: Negative for fever, chills, diaphoresis and fatigue.  Gastrointestinal: Negative for nausea, vomiting, abdominal pain, diarrhea and constipation.  Genitourinary: Negative for dysuria, urgency, frequency, hematuria, flank pain, decreased urine volume, vaginal bleeding, vaginal discharge, difficulty urinating, genital sores, vaginal pain, pelvic pain and dyspareunia.   Past Medical History  Diagnosis Date  . Hypertension   . 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 Hyperlipidemia   . Allergy     seasonal   History   Social History  . Marital Status: Married    Spouse Name: N/A    Number of Children: N/A  . Years of Education: N/A   Occupational History  . Geologist, engineering    Social History Main Topics  . Smoking status: Never Smoker   . Smokeless tobacco: Not on file  . Alcohol Use: No  . Drug Use: Not on file  . Sexual Activity: Yes   Other Topics Concern  . Not on file   Social History Narrative   Marital status:  Married x 33 years; happily married; no abuse.      Children: one daughter (32) [name]; no grandchildren.      Lives: with husband in [place].      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      Tobacco:  Never.      Alcohol: none      Drugs: none      Exercise:  Job very physical; walking during the week.  Walking on walking track 3 days per week.   Seatbelt:  100%      Guns: loaded; unsecured.      Sunscreen:  None; rare sun exposure.        Objective:   Physical Exam  Constitutional: She is oriented to person, place, and time. She appears well-developed and well-nourished. No distress.  HENT:  Head: Normocephalic and atraumatic.  Eyes: Conjunctivae and EOM are normal. Pupils are equal, round, and reactive to light.  Neck: Normal range of motion. Neck supple. Carotid bruit is not present. No thyromegaly present.  Cardiovascular: Normal rate, regular rhythm, normal heart sounds and intact distal pulses.  Exam reveals no gallop and no friction rub.   No murmur heard. Pulmonary/Chest: Effort normal and breath sounds normal. She has no wheezes. She has no rales.  Abdominal: Soft. Bowel sounds are normal. She exhibits no distension and no mass. There is no tenderness. There is no rebound and no guarding.  Genitourinary: Rectum normal and vagina normal. No labial fusion. There is no rash, tenderness, lesion or injury on the right labia. There is no rash, tenderness, lesion or injury on the left labia. Right adnexum displays no mass, no tenderness and no fullness. Left adnexum displays no mass, no tenderness and no fullness. No erythema, tenderness or bleeding around the vagina. No foreign body around the vagina. No signs of injury around the vagina. No vaginal discharge  found.  +mild prolapse of vaginal wall with valsalva.  +weakness of rectal wall with valsalva.  Lymphadenopathy:    She has no cervical adenopathy.  Neurological: She is alert and oriented to person, place, and time. No cranial nerve deficit.  Skin: Skin is warm and dry. No rash noted. She is not diaphoretic. No erythema. No pallor.  Psychiatric: She has a normal mood and affect. Her behavior is normal.         Assessment & Plan:  Rectocele, female  1. Rectocele:  New.  S/p hysterectomy due to uterine prolapse within past two years; refer back to gynecology.     Nilda Simmer, M.D.  [facility] [address] [place], [place]  [address] [phone] phone [phone] fax

## 2013-05-17 HISTORY — PX: COLPOPEXY: SHX920

## 2013-06-01 NOTE — Progress Notes (Signed)
Received form for Dr Tamala Julian to sign prior to surgery at [facility] [place] to authorize her to stop aspirin therapy. Dr Tamala Julian completed and was faxed to [facility] NP-C, then scanned.

## 2013-06-15 ENCOUNTER — Other Ambulatory Visit: Payer: Self-pay | Admitting: Family Medicine

## 2013-06-15 ENCOUNTER — Other Ambulatory Visit: Payer: Self-pay

## 2013-06-15 DIAGNOSIS — Z853 Personal history of malignant neoplasm of breast: Secondary | ICD-10-CM

## 2013-06-15 DIAGNOSIS — Z1231 Encounter for screening mammogram for malignant neoplasm of breast: Secondary | ICD-10-CM

## 2013-06-15 DIAGNOSIS — Z9011 Acquired absence of right breast and nipple: Secondary | ICD-10-CM

## 2013-08-24 ENCOUNTER — Ambulatory Visit: Payer: BC Managed Care – PPO | Admitting: Family Medicine

## 2013-08-26 ENCOUNTER — Encounter: Payer: Self-pay | Admitting: Family Medicine

## 2013-08-26 ENCOUNTER — Ambulatory Visit (INDEPENDENT_AMBULATORY_CARE_PROVIDER_SITE_OTHER): Payer: BC Managed Care – PPO | Admitting: Family Medicine

## 2013-08-26 VITALS — BP 150/92 | HR 75 | Temp 98.3°F | Resp 16 | Ht 70.0 in | Wt 172.4 lb

## 2013-08-26 DIAGNOSIS — E7800 Pure hypercholesterolemia, unspecified: Secondary | ICD-10-CM

## 2013-08-26 DIAGNOSIS — I10 Essential (primary) hypertension: Secondary | ICD-10-CM

## 2013-08-26 DIAGNOSIS — S46811A Strain of other muscles, fascia and tendons at shoulder and upper arm level, right arm, initial encounter: Secondary | ICD-10-CM

## 2013-08-26 DIAGNOSIS — S43499A Other sprain of unspecified shoulder joint, initial encounter: Secondary | ICD-10-CM

## 2013-08-26 DIAGNOSIS — S46819A Strain of other muscles, fascia and tendons at shoulder and upper arm level, unspecified arm, initial encounter: Secondary | ICD-10-CM

## 2013-08-26 DIAGNOSIS — J309 Allergic rhinitis, unspecified: Secondary | ICD-10-CM

## 2013-08-26 DIAGNOSIS — K5900 Constipation, unspecified: Secondary | ICD-10-CM

## 2013-08-26 LAB — COMPLETE METABOLIC PANEL WITH GFR
ALBUMIN: 4.8 g/dL (ref 3.5–5.2)
ALK PHOS: 59 U/L (ref 39–117)
ALT: 12 U/L (ref 0–35)
AST: 17 U/L (ref 0–37)
BUN: 9 mg/dL (ref 6–23)
CO2: 29 mEq/L (ref 19–32)
Calcium: 9.8 mg/dL (ref 8.4–10.5)
Chloride: 95 mEq/L — ABNORMAL LOW (ref 96–112)
Creat: 0.67 mg/dL (ref 0.50–1.10)
GFR, Est African American: >89 mL/min
GFR, Est Non African American: >89 mL/min
Glucose, Bld: 92 mg/dL (ref 70–99)
POTASSIUM: 4 meq/L (ref 3.5–5.3)
SODIUM: 136 meq/L (ref 135–145)
TOTAL PROTEIN: 7.2 g/dL (ref 6.0–8.3)
Total Bilirubin: 0.8 mg/dL (ref 0.2–1.2)

## 2013-08-26 LAB — CBC WITH DIFFERENTIAL/PLATELET
BASOS ABS: 0 10*3/uL (ref 0.0–0.1)
BASOS PCT: 1 % (ref 0–1)
Eosinophils Absolute: 0.2 10*3/uL (ref 0.0–0.7)
Eosinophils Relative: 6 % — ABNORMAL HIGH (ref 0–5)
HCT: 36.7 % (ref 36.0–46.0)
Hemoglobin: 12.6 g/dL (ref 12.0–15.0)
Lymphocytes Relative: 31 % (ref 12–46)
Lymphs Abs: 1.1 10*3/uL (ref 0.7–4.0)
MCH: 30.2 pg (ref 26.0–34.0)
MCHC: 34.3 g/dL (ref 30.0–36.0)
MCV: 88 fL (ref 78.0–100.0)
Monocytes Absolute: 0.5 10*3/uL (ref 0.1–1.0)
Monocytes Relative: 13 % — ABNORMAL HIGH (ref 3–12)
NEUTROS ABS: 1.8 10*3/uL (ref 1.7–7.7)
Neutrophils Relative %: 49 % (ref 43–77)
Platelets: 421 10*3/uL — ABNORMAL HIGH (ref 150–400)
RBC: 4.17 MIL/uL (ref 3.87–5.11)
RDW: 13.9 % (ref 11.5–15.5)
WBC: 3.6 10*3/uL — ABNORMAL LOW (ref 4.0–10.5)

## 2013-08-26 LAB — LIPID PANEL
CHOL/HDL RATIO: 2.8 ratio
Cholesterol: 183 mg/dL (ref 0–200)
HDL: 65 mg/dL (ref >39)
LDL CALC: 103 mg/dL — AB (ref 0–99)
Triglycerides: 76 mg/dL (ref <150)
VLDL: 15 mg/dL (ref 0–40)

## 2013-08-26 MED ORDER — FLUTICASONE PROPIONATE 50 MCG/ACT NA SUSP: NASAL | Status: DC

## 2013-08-26 MED ORDER — LISINOPRIL 20 MG PO TABS: 10.0000 mg | ORAL_TABLET | Freq: Every day | ORAL | Status: DC

## 2013-08-26 NOTE — Progress Notes (Signed)
This chart was scribed for Wardell Honour, MD by Allena Earing, ED Scribe. This patient was seen in room 21 and the patient's care was started at 9:28 AM .  Subjective:    Patient ID: [name], female    DOB: 17-Feb-1951, 63 y.o.   MRN: [id number]  Chief Complaint  Patient presents with  . Hypertension  . hypercholesterolemia  . Medication Refill    Lisinopril 20, Flonase       Hypertension Associated symptoms include neck pain. Pertinent negatives include no chest pain, headaches, palpitations or shortness of breath.    HPI Comments: [name] is a 63 y.o. female with h/o HTN who presents to the [facility] for a 6 month f/u. She reports that she had surgery since the last time she was here, she reports that it was a hernia repair and vaginal prolapse. Pt reports that her BP at home is between 107-125 and the bottom number is no higher than 80. Her BP measured today was 150/92, she believes that "driving or going to a doctor makes her BP elevated".  She also states that she has been "running around more at work" and that her work is a good work out for her. Pt also reports right shoulder pain that has been present for 2 days.  She also states that she no longer has constipation issues, "her system has been running normally for a  week". Pt has been taking senna supplements for her constipation, she reports some effect. She is now have 1 BM per day.  Pt has been taking her medications normally, she has stopped taking Zyrtec as her allergies have not been bothering her.  She is taking Flonase daily.  Pt has hysterectomy last year. She reports that after her most recent surgery, her BP was too low.  She also reports that her knees have been popping more than they used to, she has been trying to increase her calcium intact through her diet. She does not take calcium supplements due to her h/o constipation.    Past Medical History  Diagnosis Date  .  Hypertension   . 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 Hyperlipidemia   . Allergy     seasonal   Past Surgical History  Procedure Laterality Date  . Mastectomy modified radical  2008  . Abdominal hysterectomy  03/16/2012    uterine prolapse.  Ovaries resected.   [facility].  . Colonoscopy  04/17/2007    normal; repeat in 10 years.  [facility].    Current Outpatient Prescriptions on File Prior to Visit  Medication Sig Dispense Refill  . aspirin 81 MG tablet Take 81 mg by mouth daily.      . hydrochlorothiazide (HYDRODIURIL) 25 MG tablet Take 1 tablet (25 mg total) by mouth daily.  90 tablet  3  . letrozole (FEMARA) 2.5 MG tablet Take 2.5 mg by mouth daily.      . simvastatin (ZOCOR) 20 MG tablet Take 1 tablet (20 mg total) by mouth every evening.  90 tablet  3  . cetirizine (ZYRTEC) 10 MG tablet Take 1 tablet (10 mg total) by mouth daily.  30 tablet  1  . Cholecalciferol (VITAMIN D-3 PO) Take 1,000 Units by mouth daily.      . Multiple Vitamins-Calcium (VIACTIV MULTI-VITAMIN) CHEW Chew by mouth daily.      Marland Kitchen OVER THE COUNTER MEDICATION        No current facility-administered  medications on file prior to visit.   Allergies  Allergen Reactions  . Codeine Other (See Comments)    hallunication  . Eucalyptus Flavor [Flavoring Agent] Other (See Comments)    Causes difficulty in breathing   History   Social History  . Marital Status: Married    Spouse Name: N/A    Number of Children: N/A  . Years of Education: N/A   Occupational History  . Museum/gallery curator    Social History Main Topics  . Smoking status: Never Smoker   . Smokeless tobacco: Not on file  . Alcohol Use: No  . Drug Use: Not on file  . Sexual Activity: Yes   Other Topics Concern  . Not on file   Social History Narrative   Marital status:  Married x 33 years; happily married; no abuse.      Children: one daughter (1) [place]; no grandchildren.      Lives: with husband in [place].      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  Working at [employer] 20 hours per week.      Tobacco:  Never.      Alcohol: none      Drugs: none      Exercise:  Job very physical; walking during the week.  Walking on walking track 3 days per week.      Seatbelt:  100%      Guns: loaded; unsecured.      Sunscreen:  None; rare sun exposure.   Family History  Problem Relation Age of Onset  . Stroke Mother     TIAs in 106s.  . Hypertension Mother   . Heart disease Father 45    AMI age 90  . Hypertension Father   . Drug abuse Sister   . Cancer Sister     CLL    Review of Systems  Constitutional: Negative for fever, chills, diaphoresis, activity change, appetite change, fatigue and unexpected weight change.  HENT: Negative for congestion, dental problem, postnasal drip, rhinorrhea, sinus pressure, sneezing and sore throat.   Respiratory: Negative for cough and shortness of breath.   Cardiovascular: Negative for chest pain, palpitations and leg swelling.  Gastrointestinal: Positive for constipation (chronic, is getting better). Negative for nausea, vomiting and abdominal pain.  Musculoskeletal: Positive for arthralgias (shoulder), myalgias and neck pain. Negative for neck stiffness.  Skin: Negative for rash.  Neurological: Negative for dizziness, syncope, speech difficulty, weakness, light-headedness and headaches.       Objective:   Physical Exam  Nursing note and vitals reviewed. Constitutional: She is oriented to person, place, and time. She appears well-developed and well-nourished.  HENT:  Head: Normocephalic and atraumatic.  Right Ear: External ear normal.  Left Ear: External ear normal.  Nose: Nose normal.  Mouth/Throat: Oropharynx is clear and moist.  Eyes: Conjunctivae and EOM are normal. Pupils are equal, round, and reactive to light. No scleral icterus.  Neck: Normal range of motion. Neck supple. No thyromegaly present.  Cardiovascular: Normal rate, regular rhythm and  normal heart sounds.  Exam reveals no gallop and no friction rub.   No murmur heard. Pulmonary/Chest: Effort normal and breath sounds normal. No stridor. She has no wheezes. She has no rales. She exhibits no tenderness.  Abdominal: She exhibits no distension. There is no tenderness. There is no rebound.  Musculoskeletal: Normal range of motion. She exhibits no edema.       Right shoulder: She exhibits normal range of motion,  no tenderness, no bony tenderness, no pain, no spasm, normal pulse and normal strength.       Cervical back: She exhibits tenderness and pain. She exhibits normal range of motion, no spasm and normal pulse.  CERVICAL SPINE:  +TTP TRAPEZIUS REGION; PAIN WITH ROM OF NECK.  Lymphadenopathy:    She has no cervical adenopathy.  Neurological: She is alert and oriented to person, place, and time. She exhibits normal muscle tone. Coordination normal.  Skin: Skin is warm. No rash noted. No erythema.  Psychiatric: She has a normal mood and affect. Her behavior is normal.   Filed Vitals:   08/26/13 0900  BP: 150/92  Pulse: 75  Temp: 98.3 F (36.8 C)  Resp: 16    9:44 AM- BP measured 130/90      Assessment & Plan:  Essential hypertension, benign - Plan: CBC with Differential, COMPLETE METABOLIC PANEL WITH GFR, lisinopril (PRINIVIL,ZESTRIL) 20 MG tablet  Pure hypercholesterolemia - Plan: Lipid panel  Allergic rhinitis, cause unspecified  Strain of right trapezius muscle  Allergic rhinitis - Plan: fluticasone (FLONASE) 50 MCG/ACT nasal spray  Unspecified constipation  1.  HTN: controlled at home; obtain labs; continue current medications.  Advised to bring in home BP cuff to next visit for comparison. 2.  Hyperlipidemia:  Controlled on 1/2 tablet statin daily; obtain labs; continue current medication. 3.  Allergic rhinitis: controlled; refill of Flonase provided. 4.  R trapezius strain:  New.  Recommend heat to area, Ibuprofen PRN, stretches. Avoid heavy lifting for  the next two weeks. 5.  Constipation: persistent but recent improvement; colonoscopy UTD; continue Senna PRN.      9:45 AM-Discussed treatment plan which includes buying a heating pad, supplements for her constipation, refilling her medication, and avoiding aggravating her shoulder with pt at bedside and pt agreed to plan.    I personally performed the services described in this documentation, which was scribed in my presence. The recorded information has been reviewed and is accurate.  Reginia Forts, M.D.  [facility] [address] [place], [place]  [address] [phone] phone [phone] fax

## 2013-08-28 ENCOUNTER — Ambulatory Visit
Admission: RE | Admit: 2013-08-28 | Discharge: 2013-08-28 | Disposition: A | Discharge status: Home / Self Care | Payer: BC Managed Care – PPO | Source: Ambulatory Visit | Attending: Family Medicine | Admitting: Family Medicine

## 2013-08-28 ENCOUNTER — Ambulatory Visit: Payer: BC Managed Care – PPO

## 2013-08-28 DIAGNOSIS — Z853 Personal history of malignant neoplasm of breast: Secondary | ICD-10-CM

## 2013-08-28 DIAGNOSIS — Z1231 Encounter for screening mammogram for malignant neoplasm of breast: Secondary | ICD-10-CM

## 2013-08-28 DIAGNOSIS — Z9011 Acquired absence of right breast and nipple: Secondary | ICD-10-CM

## 2013-09-03 ENCOUNTER — Telehealth: Payer: Self-pay

## 2013-09-03 ENCOUNTER — Encounter: Payer: Self-pay | Admitting: Family Medicine

## 2013-09-03 NOTE — Telephone Encounter (Signed)
PATIENT WOULD LIKE DR. Tamala Cook TO KNOW THAT HER CONSTIPATION HAS RETURNED. SHE WAS NOT GIVEN ANY MEDICATION. SHE WOULD LIKE TO KNOW WHAT SHE SHOULD DO NEXT? BEST PHONE [phone] (HOME)   [place], [place]

## 2013-09-04 NOTE — Telephone Encounter (Signed)
Spoke with patient - suggested Colace, Miralax and increase fluids and fruits and fiber to help with her constipation.

## 2013-09-04 NOTE — Telephone Encounter (Signed)
Left message on machine to call back  

## 2013-09-04 NOTE — Telephone Encounter (Signed)
How much senna is she taking daily?  How many can she take per directions of Senna bottle?

## 2013-09-04 NOTE — Telephone Encounter (Signed)
Patient returned phone call. Please call [phone]

## 2014-01-14 ENCOUNTER — Ambulatory Visit: Payer: Self-pay | Admitting: Internal Medicine

## 2014-01-14 LAB — CBC CANCER CENTER
BASOS ABS: 0.1 x10 3/mm (ref 0.0–0.1)
Basophil %: 1.8 %
Eosinophil #: 0.3 x10 3/mm (ref 0.0–0.7)
Eosinophil %: 7.3 %
HCT: 39 % (ref 35.0–47.0)
HGB: 13 g/dL (ref 12.0–16.0)
Lymphocyte #: 1.3 x10 3/mm (ref 1.0–3.6)
Lymphocyte %: 30.9 %
MCH: 31 pg (ref 26.0–34.0)
MCHC: 33.3 g/dL (ref 32.0–36.0)
MCV: 93 fL (ref 80–100)
MONO ABS: 0.4 x10 3/mm (ref 0.2–0.9)
Monocyte %: 8.4 %
NEUTROS PCT: 51.6 %
Neutrophil #: 2.2 x10 3/mm (ref 1.4–6.5)
PLATELETS: 374 x10 3/mm (ref 150–440)
RBC: 4.2 10*6/uL (ref 3.80–5.20)
RDW: 12.9 % (ref 11.5–14.5)
WBC: 4.2 x10 3/mm (ref 3.6–11.0)

## 2014-01-14 LAB — HEPATIC FUNCTION PANEL A (ARMC)
ALBUMIN: 4.3 g/dL (ref 3.4–5.0)
AST: 22 U/L (ref 15–37)
Alkaline Phosphatase: 69 U/L
BILIRUBIN TOTAL: 0.6 mg/dL (ref 0.2–1.0)
Bilirubin, Direct: 0.4 mg/dL — ABNORMAL HIGH (ref 0.00–0.20)
SGPT (ALT): 25 U/L
Total Protein: 8 g/dL (ref 6.4–8.2)

## 2014-01-14 LAB — CREATININE, SERUM
CREATININE: 0.81 mg/dL (ref 0.60–1.30)
EGFR (Non-African Amer.): >60

## 2014-01-15 LAB — CANCER ANTIGEN 27.29: CA 27.29: 19.4 U/mL (ref 0.0–38.6)

## 2014-02-14 ENCOUNTER — Ambulatory Visit: Payer: Self-pay | Admitting: Internal Medicine

## 2014-02-25 ENCOUNTER — Telehealth: Payer: Self-pay

## 2014-02-25 NOTE — Telephone Encounter (Signed)
Spoke to [name] she is going to wait until her appt in November with Dr. Tamala Julian

## 2014-02-25 NOTE — Telephone Encounter (Signed)
Patient states her BP was recently 113/65. Wants to know if she can come off of her BP medication. States she has been feeling sort of dizzy. Please return call and advise. CB # [phone]

## 2014-03-10 ENCOUNTER — Encounter: Payer: Self-pay | Admitting: Family Medicine

## 2014-03-10 ENCOUNTER — Ambulatory Visit (INDEPENDENT_AMBULATORY_CARE_PROVIDER_SITE_OTHER): Payer: BC Managed Care – PPO | Admitting: Family Medicine

## 2014-03-10 VITALS — BP 140/84 | HR 68 | Temp 98.0°F | Resp 16 | Ht 70.5 in | Wt 176.0 lb

## 2014-03-10 DIAGNOSIS — E785 Hyperlipidemia, unspecified: Secondary | ICD-10-CM

## 2014-03-10 DIAGNOSIS — J3089 Other allergic rhinitis: Secondary | ICD-10-CM

## 2014-03-10 DIAGNOSIS — E7800 Pure hypercholesterolemia, unspecified: Secondary | ICD-10-CM

## 2014-03-10 DIAGNOSIS — Z0000 Encounter for general adult medical examination without abnormal findings: Secondary | ICD-10-CM

## 2014-03-10 DIAGNOSIS — Z23 Encounter for immunization: Secondary | ICD-10-CM

## 2014-03-10 DIAGNOSIS — C50911 Malignant neoplasm of unspecified site of right female breast: Secondary | ICD-10-CM

## 2014-03-10 DIAGNOSIS — I10 Essential (primary) hypertension: Secondary | ICD-10-CM

## 2014-03-10 LAB — CBC WITH DIFFERENTIAL/PLATELET
Basophils Absolute: 0.1 10*3/uL (ref 0.0–0.1)
Basophils Relative: 2 % — ABNORMAL HIGH (ref 0–1)
EOS ABS: 0.3 10*3/uL (ref 0.0–0.7)
Eosinophils Relative: 6 % — ABNORMAL HIGH (ref 0–5)
HCT: 36.7 % (ref 36.0–46.0)
Hemoglobin: 12.5 g/dL (ref 12.0–15.0)
Lymphocytes Relative: 29 % (ref 12–46)
Lymphs Abs: 1.2 10*3/uL (ref 0.7–4.0)
MCH: 30.6 pg (ref 26.0–34.0)
MCHC: 34.1 g/dL (ref 30.0–36.0)
MCV: 89.7 fL (ref 78.0–100.0)
MPV: 9.8 fL (ref 9.4–12.4)
Monocytes Absolute: 0.4 10*3/uL (ref 0.1–1.0)
Monocytes Relative: 10 % (ref 3–12)
NEUTROS PCT: 53 % (ref 43–77)
Neutro Abs: 2.2 10*3/uL (ref 1.7–7.7)
PLATELETS: 368 10*3/uL (ref 150–400)
RBC: 4.09 MIL/uL (ref 3.87–5.11)
RDW: 12.5 % (ref 11.5–15.5)
WBC: 4.2 10*3/uL (ref 4.0–10.5)

## 2014-03-10 LAB — POCT URINALYSIS DIPSTICK
Bilirubin, UA: NEGATIVE
Glucose, UA: NEGATIVE
KETONES UA: NEGATIVE
Leukocytes, UA: NEGATIVE
Nitrite, UA: NEGATIVE
PROTEIN UA: NEGATIVE
RBC UA: NEGATIVE
Spec Grav, UA: 1.01
Urobilinogen, UA: 0.2
pH, UA: 7.5

## 2014-03-10 LAB — LIPID PANEL
CHOL/HDL RATIO: 2.7 ratio
CHOLESTEROL: 153 mg/dL (ref 0–200)
HDL: 57 mg/dL (ref >39)
LDL Cholesterol: 83 mg/dL (ref 0–99)
TRIGLYCERIDES: 66 mg/dL (ref <150)
VLDL: 13 mg/dL (ref 0–40)

## 2014-03-10 LAB — COMPLETE METABOLIC PANEL WITH GFR
ALK PHOS: 54 U/L (ref 39–117)
ALT: 12 U/L (ref 0–35)
AST: 18 U/L (ref 0–37)
Albumin: 4.4 g/dL (ref 3.5–5.2)
BUN: 7 mg/dL (ref 6–23)
CO2: 30 mEq/L (ref 19–32)
Calcium: 9.5 mg/dL (ref 8.4–10.5)
Chloride: 94 mEq/L — ABNORMAL LOW (ref 96–112)
Creat: 0.69 mg/dL (ref 0.50–1.10)
GFR, Est African American: >89 mL/min
Glucose, Bld: 85 mg/dL (ref 70–99)
Potassium: 4 mEq/L (ref 3.5–5.3)
SODIUM: 134 meq/L — AB (ref 135–145)
TOTAL PROTEIN: 7.1 g/dL (ref 6.0–8.3)
Total Bilirubin: 0.7 mg/dL (ref 0.2–1.2)

## 2014-03-10 LAB — TSH: TSH: 1.045 u[IU]/mL (ref 0.350–4.500)

## 2014-03-10 NOTE — Patient Instructions (Signed)

## 2014-03-10 NOTE — Progress Notes (Signed)
Subjective:    Patient ID: [name], female    DOB: 11-11-1950, 63 y.o.   MRN: [id number]  03/10/2014  Annual Exam   HPI This 64 y.o. female presents for Complete Physical Examination.  Last physical:  02/24/2013 Pap smear: 2013; hysterectomy secondary to prolapse.  Ovaries removed. Mammogram: 08/28/2013 Colonoscopy: 2009 Bone density: unsure of date. TDAP:  2010 Pneumovax:  never Zostavax: 2013 Influenza: today.   Eye exam:  2014 Dental exam:  Every six months.   Breast cancer: s/p follow-up with oncology.  Having bump on R breast.  Will complete Femara in 08/2014.    Uterine prolapse: started having recurrent vaginal symptoms; felt secondary to constipation.  Having urinary hesitancy.  Urogynecologist recommended taking Miralax daily.    Constipation: trying gluten free food; taking Miralax daily but Miralax is bloating.    HTN:  Brought in BP cuff.   Home BP readings running 120s/70s.     Review of Systems  Constitutional: Negative for fever, chills, diaphoresis, activity change, appetite change, fatigue and unexpected weight change.  HENT: Negative for congestion, dental problem, drooling, ear discharge, ear pain, facial swelling, hearing loss, mouth sores, nosebleeds, postnasal drip, rhinorrhea, sinus pressure, sneezing, sore throat, tinnitus, trouble swallowing and voice change.   Eyes: Negative for photophobia, pain, discharge, redness, itching and visual disturbance.  Respiratory: Negative for apnea, cough, choking, chest tightness, shortness of breath, wheezing and stridor.   Cardiovascular: Negative for chest pain, palpitations and leg swelling.  Gastrointestinal: Positive for constipation. Negative for nausea, vomiting, abdominal pain, diarrhea, blood in stool, abdominal distention, anal bleeding and rectal pain.  Endocrine: Negative for cold intolerance, heat intolerance, polydipsia, polyphagia and polyuria.  Genitourinary: Negative for dysuria, urgency,  frequency, hematuria, flank pain, decreased urine volume, vaginal bleeding, vaginal discharge, enuresis, difficulty urinating, genital sores, vaginal pain, menstrual problem, pelvic pain and dyspareunia.  Musculoskeletal: Negative for myalgias, back pain, joint swelling, arthralgias, gait problem, neck pain and neck stiffness.  Skin: Negative for color change, pallor, rash and wound.  Allergic/Immunologic: Negative for environmental allergies, food allergies and immunocompromised state.  Neurological: Negative for dizziness, tremors, seizures, syncope, facial asymmetry, speech difficulty, weakness, light-headedness, numbness and headaches.  Hematological: Negative for adenopathy. Does not bruise/bleed easily.  Psychiatric/Behavioral: Negative for suicidal ideas, hallucinations, behavioral problems, confusion, sleep disturbance, self-injury, dysphoric mood, decreased concentration and agitation. The patient is not nervous/anxious and is not hyperactive.     Past Medical History  Diagnosis Date  . Hypertension   . 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 Hyperlipidemia   . Allergy     seasonal   Past Surgical History  Procedure Laterality Date  . Mastectomy modified radical  2008  . Abdominal hysterectomy  03/16/2012    uterine prolapse.  Ovaries resected.   [facility].  . Colonoscopy  04/17/2007    normal; repeat in 10 years.  [facility].  . Colpopexy  05/17/13    [facility]   Allergies  Allergen Reactions  . Codeine Other (See Comments)    hallunication  . Eucalyptus Flavor [Flavoring Agent] Other (See Comments)    Causes difficulty in breathing   Current Outpatient Prescriptions  Medication Sig Dispense Refill  . aspirin 81 MG tablet Take 81 mg by mouth daily.    . cetirizine (ZYRTEC) 10 MG tablet Take 1 tablet (10 mg total) by mouth daily. 30 tablet 1  . Cholecalciferol (VITAMIN D-3 PO) Take 1,000 Units by mouth daily.    . fluticasone (FLONASE) 50  MCG/ACT nasal spray 2 sprays into  each nostril daily. 16 g 11  . hydrochlorothiazide (HYDRODIURIL) 25 MG tablet Take 1 tablet (25 mg total) by mouth daily. 90 tablet 3  . letrozole (FEMARA) 2.5 MG tablet Take 2.5 mg by mouth daily.    Marland Kitchen lisinopril (PRINIVIL,ZESTRIL) 20 MG tablet Take 0.5 tablets (10 mg total) by mouth daily. 90 tablet 1  . OVER THE COUNTER MEDICATION     . OVER THE COUNTER MEDICATION OTC Nasal Saline spray taking    . simvastatin (ZOCOR) 20 MG tablet Take 1 tablet (20 mg total) by mouth every evening. (Patient taking differently: Take 10 mg by mouth every evening. ) 90 tablet 3  . Multiple Vitamins-Calcium (VIACTIV MULTI-VITAMIN) CHEW Chew by mouth daily.     No current facility-administered medications for this visit.       Objective:    BP 140/84 mmHg  Pulse 68  Temp(Src) 98 F (36.7 C) (Oral)  Resp 16  Ht 5' 10.5" (1.791 m)  Wt 176 lb (79.833 kg)  BMI 24.89 kg/m2  SpO2 100% Physical Exam  Constitutional: She is oriented to person, place, and time. She appears well-developed and well-nourished. No distress.  HENT:  Head: Normocephalic and atraumatic.  Right Ear: External ear normal.  Left Ear: External ear normal.  Nose: Nose normal.  Mouth/Throat: Oropharynx is clear and moist.  Eyes: Conjunctivae and EOM are normal. Pupils are equal, round, and reactive to light.  Neck: Normal range of motion and full passive range of motion without pain. Neck supple. No JVD present. Carotid bruit is not present. No thyromegaly present.  Cardiovascular: Normal rate, regular rhythm and normal heart sounds.  Exam reveals no gallop and no friction rub.   No murmur heard. Pulmonary/Chest: Effort normal and breath sounds normal. She has no wheezes. She has no rales. Left breast exhibits no mass, no skin change and no tenderness. Breasts are asymmetrical.  Mastectomy on R; well healed scars; +subcutaneous nodule present medial inferior aspect.    Abdominal: Soft. Bowel sounds are normal. She exhibits no distension  and no mass. There is no tenderness. There is no rebound and no guarding.  Genitourinary: Vagina normal. There is no rash, tenderness or lesion on the right labia. There is no rash, tenderness or lesion on the left labia.  Musculoskeletal:       Right shoulder: Normal.       Left shoulder: Normal.       Cervical back: Normal.  Lymphadenopathy:    She has no cervical adenopathy.  Neurological: She is alert and oriented to person, place, and time. She has normal reflexes. No cranial nerve deficit. She exhibits normal muscle tone. Coordination normal.  Skin: Skin is warm and dry. No rash noted. She is not diaphoretic. No erythema. No pallor.  Psychiatric: She has a normal mood and affect. Her behavior is normal. Judgment and thought content normal.  Nursing note and vitals reviewed.  INFLUENZA VACCINE ADMINISTERED.     Assessment & Plan:   1. Need for prophylactic vaccination and inoculation against influenza   2. Annual physical exam   3. Essential hypertension, benign   4. Hyperlipidemia   5. Breast cancer, right   6. Other allergic rhinitis       1. Complete physical examination: anticipatory guidance --- exercise, weight loss, 3 servings of dairy daily.  Pap smear no longer warranted due to hysterectomy status. Mammogram UTD.  Colonoscopy UTD. Clarify date of last bone density scan.  Immunizations  UTD; s/p influenza vaccine in office.   2.  HTN: controlled; home BP cuff readings the same as office cuff.  No change in management.  Obtain labs. 3. Hyperlipidemia: controlled; obtain labs; refills provided. 4.  R breast cancer: stable; s/p mastectomy; continues to follow-up with oncology; to complete Femara in 08/2014. 5.  Allergic Rhinitis: controlled; no changes in therapy; advised to restart Zyrtec PRN. 6. Constipation; uncontrolled; support urogynecology recommendation to take Miralax daily to prevent worsening of rectocele small.   7. S/p flu vaccine.   No orders of the defined  types were placed in this encounter.    Return in about 6 months (around 09/08/2014) for recheck high blood pressure, high cholesterol.    Reginia Forts, M.D.  [facility] [address] [place], [place]  [address] [phone] phone [phone] fax

## 2014-03-11 LAB — HEMOGLOBIN A1C
Hgb A1c MFr Bld: 6 % — ABNORMAL HIGH (ref <5.7)
Mean Plasma Glucose: 126 mg/dL — ABNORMAL HIGH (ref <117)

## 2014-03-13 MED ORDER — LISINOPRIL 10 MG PO TABS: 10.0000 mg | ORAL_TABLET | Freq: Every day | ORAL | Status: DC

## 2014-03-13 MED ORDER — HYDROCHLOROTHIAZIDE 25 MG PO TABS: 25.0000 mg | ORAL_TABLET | Freq: Every day | ORAL | Status: DC

## 2014-03-13 MED ORDER — SIMVASTATIN 20 MG PO TABS: 10.0000 mg | ORAL_TABLET | Freq: Every evening | ORAL | Status: DC

## 2014-03-14 ENCOUNTER — Other Ambulatory Visit: Payer: Self-pay | Admitting: Family Medicine

## 2014-04-11 ENCOUNTER — Other Ambulatory Visit: Payer: Self-pay | Admitting: Family Medicine

## 2014-07-23 ENCOUNTER — Other Ambulatory Visit: Payer: Self-pay

## 2014-07-23 DIAGNOSIS — Z1231 Encounter for screening mammogram for malignant neoplasm of breast: Secondary | ICD-10-CM

## 2014-08-06 NOTE — Op Note (Signed)
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  PROCEDURE:  Total vaginal hysterectomy, bilateral salpingo-oophorectomy, McCall culdoplasty, anterior repair.   ESTIMATED BLOOD LOSS:  Approximately 50 mL.   SURGEON:  Delsa Sale, M.D.   ASSISTANT:  Teresa Coombs, M.D.   FINDINGS:  Cervix is prolapsed down to the introitus, normal postpartum ovaries bilaterally.  Cystocele.   PROCEDURE:  The patient was taken to the operating room and was placed in supine position.  After adequate general endotracheal anesthesia was instilled the patient was prepped and draped in the usual sterile fashion.  Timeout was performed.  The patient was placed in lightning bolt stirrups.  A heavy weighted speculum was placed in the patient's vagina and the cervix was grasped with a Lahey clamp.  Uterosacral ligaments were massaged to help descensus of the uterus.  The anterior and posterior aspect of the uterovaginal junction were injected with lidocaine.  A knife was used to circumferentially cut around the cervix.  The posterior cul-de-sac was grasped with single-toothed pick-up and sharply entered with the curved Mayo scissors.  The opening was extended and a long weighted speculum was placed into the vagina.  Heaney clamps were then used to take serial bites of the cardinal ligaments bilaterally.  These were clamped, cut and suture ligated.  The cervix and bladder were separated with sharply and blunt dissection.  Another bite was taken bilaterally with the Heaney clamps to grasp the uterosacrals.  The bladder was then dissected off the anterior aspect of the uterus and the peritoneum and vaginal mucosa were stitched together.  A curved retractor was then placed into the anterior  aspect of the peritoneal cavity.  Serial bites were taken bilaterally to remove the uterus from the body.  Babcocks were then used to grasp the tubes and ovaries, first on the right where a curved Heaney was then used to clamp across the infundibulopelvic ligament.  This was clamped, cut and suture ligated.  The tubes and ovaries were passed off the table in a similar fashion the other side.  A Kary Kos was used to grasp the tube and ovary.  These were massaged down to the opening.  A Heaney clamp was used to clamp across the infundibulopelvic ligament.  This was clamped, cut and suture ligated.  Good hemostasis was identified.  Ethibond suture was then used to grasp the uterosacrals and then a new running stitch along the back side of the posterior cul-de-sac into the right-sided uterosacral.  These are then tied together and approximated for support.  Serial sutures from the corner of the vaginal incision were made with pop-off Vicryl sutures in a running fashion.  The urethral complex was then grasped with an Allis clamp and the mucosa was injected with lidocaine.  An incision was made with a scalpel.  Two clamps were used to place along the vaginal edges and the vagina was sharply and bluntly dissected off the bladder.  The edges of the mucosal junction with the bladder were then approximated through the midline.  The vaginal mucosa was trimmed and closed with a running Monocryl suture.  Good hemostasis was identified.  The vagina was packed with Premarin, laced vaginal packing.  A Foley catheter was replaced.  Indigo carmine was given and blue urine was identified.  The patient  was then laid supine and taken to recovery after having tolerated the procedure well.     ____________________________ Delsa Sale, MD cck:ea D: 05/22/2012 16:51:00 ET T: 05/23/2012 06:54:40 ET JOB#: [id number]  cc: Delsa Sale, MD, <Dictator> Delsa Sale MD ELECTRONICALLY SIGNED 05/28/2012 0:21

## 2014-09-01 ENCOUNTER — Ambulatory Visit
Admission: RE | Admit: 2014-09-01 | Discharge: 2014-09-01 | Disposition: A | Discharge status: Home / Self Care | Payer: BC Managed Care – PPO | Source: Ambulatory Visit

## 2014-09-01 ENCOUNTER — Other Ambulatory Visit: Payer: Self-pay

## 2014-09-01 DIAGNOSIS — Z1231 Encounter for screening mammogram for malignant neoplasm of breast: Secondary | ICD-10-CM

## 2014-09-05 ENCOUNTER — Ambulatory Visit (INDEPENDENT_AMBULATORY_CARE_PROVIDER_SITE_OTHER): Payer: BC Managed Care – PPO | Admitting: Family Medicine

## 2014-09-05 VITALS — BP 144/88 | HR 81 | Temp 98.7°F | Resp 18 | Ht 71.0 in | Wt 176.2 lb

## 2014-09-05 DIAGNOSIS — K5901 Slow transit constipation: Secondary | ICD-10-CM

## 2014-09-05 DIAGNOSIS — R7302 Impaired glucose tolerance (oral): Secondary | ICD-10-CM | POA: Diagnosis not present

## 2014-09-05 DIAGNOSIS — R42 Dizziness and giddiness: Secondary | ICD-10-CM

## 2014-09-05 DIAGNOSIS — I10 Essential (primary) hypertension: Secondary | ICD-10-CM | POA: Diagnosis not present

## 2014-09-05 DIAGNOSIS — J301 Allergic rhinitis due to pollen: Secondary | ICD-10-CM

## 2014-09-05 DIAGNOSIS — E7800 Pure hypercholesterolemia, unspecified: Secondary | ICD-10-CM

## 2014-09-05 LAB — POCT CBC
Granulocyte percent: 58 %G (ref 37–80)
HEMATOCRIT: 38.9 % (ref 37.7–47.9)
Hemoglobin: 13 g/dL (ref 12.2–16.2)
LYMPH, POC: 1.4 (ref 0.6–3.4)
MCH: 30.1 pg (ref 27–31.2)
MCHC: 33.4 g/dL (ref 31.8–35.4)
MCV: 90.3 fL (ref 80–97)
MID (cbc): 0.4 (ref 0–0.9)
MPV: 7.4 fL (ref 0–99.8)
PLATELET COUNT, POC: 406 10*3/uL (ref 142–424)
POC Granulocyte: 2.6 (ref 2–6.9)
POC LYMPH PERCENT: 31.8 %L (ref 10–50)
POC MID %: 10.2 %M (ref 0–12)
RBC: 4.31 M/uL (ref 4.04–5.48)
RDW, POC: 14.1 %
WBC: 4.4 10*3/uL — AB (ref 4.6–10.2)

## 2014-09-05 LAB — COMPREHENSIVE METABOLIC PANEL
ALT: 13 U/L (ref 0–35)
AST: 16 U/L (ref 0–37)
Albumin: 5 g/dL (ref 3.5–5.2)
Alkaline Phosphatase: 63 U/L (ref 39–117)
BILIRUBIN TOTAL: 0.6 mg/dL (ref 0.2–1.2)
BUN: 8 mg/dL (ref 6–23)
CHLORIDE: 97 meq/L (ref 96–112)
CO2: 31 meq/L (ref 19–32)
Calcium: 10 mg/dL (ref 8.4–10.5)
Creat: 0.73 mg/dL (ref 0.50–1.10)
GLUCOSE: 95 mg/dL (ref 70–99)
Potassium: 3.9 mEq/L (ref 3.5–5.3)
SODIUM: 138 meq/L (ref 135–145)
Total Protein: 7.8 g/dL (ref 6.0–8.3)

## 2014-09-05 LAB — HEMOGLOBIN A1C
Hgb A1c MFr Bld: 6.2 % — ABNORMAL HIGH (ref <5.7)
MEAN PLASMA GLUCOSE: 131 mg/dL — AB (ref <117)

## 2014-09-05 LAB — POCT URINALYSIS DIPSTICK
Bilirubin, UA: NEGATIVE
Glucose, UA: NEGATIVE
KETONES UA: NEGATIVE
Nitrite, UA: NEGATIVE
PH UA: 7
Protein, UA: NEGATIVE
RBC UA: NEGATIVE
Spec Grav, UA: 1.015
UROBILINOGEN UA: 0.2

## 2014-09-05 LAB — LIPID PANEL
CHOLESTEROL: 172 mg/dL (ref 0–200)
HDL: 71 mg/dL (ref >=46)
LDL Cholesterol: 80 mg/dL (ref 0–99)
TRIGLYCERIDES: 103 mg/dL (ref <150)
Total CHOL/HDL Ratio: 2.4 Ratio
VLDL: 21 mg/dL (ref 0–40)

## 2014-09-05 MED ORDER — CETIRIZINE HCL 10 MG PO TABS: 10.0000 mg | ORAL_TABLET | Freq: Every day | ORAL | Status: DC

## 2014-09-05 MED ORDER — LISINOPRIL 10 MG PO TABS: 10.0000 mg | ORAL_TABLET | Freq: Every day | ORAL | Status: DC

## 2014-09-05 MED ORDER — FLUTICASONE PROPIONATE 50 MCG/ACT NA SUSP: NASAL | Status: DC

## 2014-09-05 MED ORDER — SIMVASTATIN 20 MG PO TABS: 10.0000 mg | ORAL_TABLET | Freq: Every evening | ORAL | Status: DC

## 2014-09-05 MED ORDER — MECLIZINE HCL 12.5 MG PO TABS: 12.5000 mg | ORAL_TABLET | Freq: Three times a day (TID) | ORAL | Status: DC | PRN

## 2014-09-05 MED ORDER — HYDROCHLOROTHIAZIDE 25 MG PO TABS: 25.0000 mg | ORAL_TABLET | Freq: Every day | ORAL | Status: DC

## 2014-09-05 NOTE — Progress Notes (Addendum)
Subjective:  This chart was scribed for Mandy Forts, MD by [name], Medical Scribe. This patient was seen in Room 2 and the patient's care was started at 8:43 AM.   Patient ID: [name], female    DOB: [birth year], 65 y.o.   MRN: [id number]  09/05/2014  Dizziness and Nausea   HPI  HPI Comments: [name] is a 64 y.o. female who presents to [facility] complaining of intermittent dizziness, gradual onset two to three weeks ago.  Patient reports that she get dizziness episodes about three times a day, lasting more when sitting down or in a supine position. She states that her symptoms are relieved when she is standing up or walking. She states that looking up or turning head does not affect her symptoms. She states that she has been able to keep her balance without falling, and it is not a room spinning sensation. Patient has not tried any medications for dizziness. She also reports symptoms of headaches are associated with it sometimes, numbness in the feet and hands more present last night than normal yet this is a chronic issue for patient since chemotherapy for breast cancer, and +heart burn. Patient denies blurred vision, focal weakness, ringing in the ear, hearing loss, nasal or chest congestion, falls, CP, palpitations, SOB, cough.  She reports that she had dizziness episodes 2 years ago that were more sever than the current one.   Patient reports that she is compliant with her Miralax medication, and that she ran out of Flonase a month ago and need a refill for that. Patient is off her zytec medication as well; allergy symptoms include nasal swelling and sneezing.   Patient reports getting her regular exercise through her work. She has not missed work as a result of her symptoms, and she states that she has been doing fine.   HTN:  No changes to management made at last visit six months ago.  Blood pressure running 110-123/ 64- 87, and HR 60-70.   Patient reports good compliance with medication, good tolerance to medication, and good symptom control.  Pt is worried that blood pressure is running too low and is contributing to dizziness.  Hyperlipidemia: no changes to management made at last visit.  Patient reports good compliance with medication, good tolerance to medication, and good symptom control.    Glucose intolerance: not exercising regularly.  Watching diet.   Review of Systems  Constitutional: Negative for fever, chills, diaphoresis and fatigue.  HENT: Positive for sneezing. Negative for congestion, ear pain, facial swelling, hearing loss, postnasal drip, rhinorrhea, sore throat and tinnitus.   Eyes: Negative for visual disturbance.  Respiratory: Negative for cough, chest tightness and shortness of breath.   Cardiovascular: Negative for chest pain, palpitations and leg swelling.  Gastrointestinal: Negative for nausea, vomiting, abdominal pain, diarrhea and constipation.  Endocrine: Negative for cold intolerance, heat intolerance, polydipsia, polyphagia and polyuria.  Neurological: Positive for dizziness, numbness and headaches. Negative for tremors, seizures, syncope, facial asymmetry, speech difficulty, weakness and light-headedness.    Past Medical History  Diagnosis Date  . Hypertension   . 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 Hyperlipidemia   . Allergy     seasonal   Past Surgical History  Procedure Laterality Date  . Mastectomy modified radical  2008  . Abdominal hysterectomy  03/16/2012    uterine prolapse.  Ovaries resected.   [facility].  . Colonoscopy  04/17/2007  normal; repeat in 10 years.  [facility].  . Colpopexy  05/17/13    [facility]   Allergies  Allergen Reactions  . Codeine Other (See Comments)    hallunication  . Eucalyptus Flavor [Flavoring Agent] Other (See Comments)    Causes difficulty in breathing   Current Outpatient Prescriptions  Medication Sig Dispense Refill  . aspirin 81 MG  tablet Take 81 mg by mouth daily.    . Cholecalciferol (VITAMIN D-3 PO) Take 1,000 Units by mouth daily.    . fluticasone (FLONASE) 50 MCG/ACT nasal spray 2 sprays into each nostril daily. 16 g 11  . hydrochlorothiazide (HYDRODIURIL) 25 MG tablet Take 1 tablet (25 mg total) by mouth daily. 90 tablet 3  . letrozole (FEMARA) 2.5 MG tablet Take 2.5 mg by mouth daily.    . Multiple Vitamins-Calcium (VIACTIV MULTI-VITAMIN) CHEW Chew by mouth daily.    . polyethylene glycol powder (MIRALAX) powder Take by mouth.    . simvastatin (ZOCOR) 20 MG tablet Take 0.5 tablets (10 mg total) by mouth every evening. 90 tablet 3  . calcium-vitamin D (SM CALCIUM 500/VITAMIN D3) 500-400 MG-UNIT per tablet Take by mouth.    . cetirizine (ZYRTEC) 10 MG tablet Take 1 tablet (10 mg total) by mouth daily. (Patient not taking: Reported on 09/05/2014) 30 tablet 1  . lisinopril (PRINIVIL,ZESTRIL) 10 MG tablet Take 1 tablet (10 mg total) by mouth daily. (Patient not taking: Reported on 09/05/2014) 90 tablet 3  . OVER THE COUNTER MEDICATION     . OVER THE COUNTER MEDICATION OTC Nasal Saline spray taking     No current facility-administered medications for this visit.   History   Social History  . Marital Status: Married    Spouse Name: N/A  . Number of Children: N/A  . Years of Education: N/A   Occupational History  . Museum/gallery curator    Social History Main Topics  . Smoking status: Never Smoker   . Smokeless tobacco: Not on file  . Alcohol Use: No  . Drug Use: Not on file  . Sexual Activity: Yes   Other Topics Concern  . Not on file   Social History Narrative   Marital status:  Married x 34 years; happily married; no abuse.      Children: one daughter (18) [name]; no grandchildren.      Lives: with husband in [place].      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  Working at [employer] 20 hours per week.      Tobacco:  Never.      Alcohol: none      Drugs: none      Exercise:  Job very  physical; walking during the week.  Walking on walking track 3 days per week.      Seatbelt:  100%      Guns: loaded; unsecured.      Sunscreen:  None; rare sun exposure.       Objective:    BP 144/88 mmHg  Pulse 81  Temp(Src) 98.7 F (37.1 C) (Oral)  Resp 18  Ht 5\' 11"  (1.803 m)  Wt 176 lb 3.2 oz (79.924 kg)  BMI 24.59 kg/m2  SpO2 97% Physical Exam  Constitutional: She is oriented to person, place, and time. She appears well-developed and well-nourished. No distress.  HENT:  Head: Normocephalic and atraumatic.  Right Ear: External ear normal.  Left Ear: External ear normal.  Nose: Nose normal.  Mouth/Throat: Oropharynx is clear and moist.  No oropharyngeal exudate.  Eyes: Conjunctivae and EOM are normal. Pupils are equal, round, and reactive to light.  Neck: Normal range of motion. Neck supple. Carotid bruit is not present. No thyromegaly present.  Cardiovascular: Normal rate, regular rhythm, normal heart sounds and intact distal pulses.  Exam reveals no gallop and no friction rub.   No murmur heard. Pulmonary/Chest: Effort normal and breath sounds normal. She has no wheezes. She has no rales.  Abdominal: Soft. Bowel sounds are normal. She exhibits no distension and no mass. There is no tenderness. There is no rebound and no guarding.  Musculoskeletal: She exhibits no edema.  2 cm area ecchymosis in the right lower shin.   Lymphadenopathy:    She has no cervical adenopathy.  Neurological: She is alert and oriented to person, place, and time. She has normal reflexes. No cranial nerve deficit. She exhibits normal muscle tone. Coordination normal.  Romberg test is negative  Marye Round is negative  Skin: Skin is warm and dry. No rash noted. She is not diaphoretic. No erythema. No pallor.  Psychiatric: She has a normal mood and affect. Her behavior is normal.  Nursing note and vitals reviewed.  Results for orders placed or performed in visit on 09/05/14  POCT CBC  Result  Value Ref Range   WBC 4.4 (A) 4.6 - 10.2 K/uL   Lymph, poc 1.4 0.6 - 3.4   POC LYMPH PERCENT 31.8 10 - 50 %L   MID (cbc) 0.4 0 - 0.9   POC MID % 10.2 0 - 12 %M   POC Granulocyte 2.6 2 - 6.9   Granulocyte percent 58.0 37 - 80 %G   RBC 4.31 4.04 - 5.48 M/uL   Hemoglobin 13.0 12.2 - 16.2 g/dL   HCT, POC 38.9 37.7 - 47.9 %   MCV 90.3 80 - 97 fL   MCH, POC 30.1 27 - 31.2 pg   MCHC 33.4 31.8 - 35.4 g/dL   RDW, POC 14.1 %   Platelet Count, POC 406 142 - 424 K/uL   MPV 7.4 0 - 99.8 fL  POCT urinalysis dipstick  Result Value Ref Range   Color, UA light yellow    Clarity, UA clear    Glucose, UA neg    Bilirubin, UA neg    Ketones, UA neg    Spec Grav, UA 1.015    Blood, UA neg    pH, UA 7.0    Protein, UA neg    Urobilinogen, UA 0.2    Nitrite, UA neg    Leukocytes, UA small (1+)    EKG: NSR; no ST changes; no arrhythmia.    Assessment & Plan:   1. Allergic rhinitis due to pollen   2. Dizziness   3. Slow transit constipation   4. Essential hypertension, benign   5. Pure hypercholesterolemia   6. Glucose intolerance (impaired glucose tolerance)     1.  Dizziness:  New.  Obtain labs; normal neurological exam. Treat allergic rhinitis and expect dizziness to improve; rx for Meclizine provided. RTC for acute worsening. 2.  Allergic Rhinitis: uncontrolled; rx for Flonase and Zyrtec provided. 3.  Constipation: controlled with daily Miralax; increase water and fiber intake. 4.  HTN: controlled; obtain labs; refills provided; RTC six months. 5.  Hyperlipidemia: controlled; obtain labs; refills provided. 6.  Glucose intolerance: controlled; obtain labs.   Meds ordered this encounter  Medications  . fluticasone (FLONASE) 50 MCG/ACT nasal spray    Sig: 2 sprays into each nostril daily.  Dispense:  16 g    Refill:  11    No Follow-up on file.  I personally performed the services described in this documentation, which was scribed in my presence. The recorded information has  been reviewed and considered.  Kaya Klausing Elayne Guerin, M.D. [facility] [address] [place], [place]  [address] [phone] phone [phone] fax

## 2014-09-05 NOTE — Patient Instructions (Signed)
1. Start taking Flonase daily. 2. Start taking Zyrtec 10mg  daily.   Dizziness Dizziness is a common problem. It is a feeling of unsteadiness or light-headedness. You may feel like you are about to faint. Dizziness can lead to injury if you stumble or fall. A person of any age group can suffer from dizziness, but dizziness is more common in older adults. CAUSES  Dizziness can be caused by many different things, including:  Middle ear problems.  Standing for too long.  Infections.  An allergic reaction.  Aging.  An emotional response to something, such as the sight of blood.  Side effects of medicines.  Tiredness.  Problems with circulation or blood pressure.  Excessive use of alcohol or medicines, or illegal drug use.  Breathing too fast (hyperventilation).  An irregular heart rhythm (arrhythmia).  A low red blood cell count (anemia).  Pregnancy.  Vomiting, diarrhea, fever, or other illnesses that cause body fluid loss (dehydration).  Diseases or conditions such as Parkinson's disease, high blood pressure (hypertension), diabetes, and thyroid problems.  Exposure to extreme heat. DIAGNOSIS  Your health care provider will ask about your symptoms, perform a physical exam, and perform an electrocardiogram (ECG) to record the electrical activity of your heart. Your health care provider may also perform other heart or blood tests to determine the cause of your dizziness. These may include:  Transthoracic echocardiogram (TTE). During echocardiography, sound waves are used to evaluate how blood flows through your heart.  Transesophageal echocardiogram (TEE).  Cardiac monitoring. This allows your health care provider to monitor your heart rate and rhythm in real time.  Holter monitor. This is a portable device that records your heartbeat and can help diagnose heart arrhythmias. It allows your health care provider to track your heart activity for several days if  needed.  Stress tests by exercise or by giving medicine that makes the heart beat faster. TREATMENT  Treatment of dizziness depends on the cause of your symptoms and can vary greatly. HOME CARE INSTRUCTIONS   Drink enough fluids to keep your urine clear or pale yellow. This is especially important in very hot weather. In older adults, it is also important in cold weather.  Take your medicine exactly as directed if your dizziness is caused by medicines. When taking blood pressure medicines, it is especially important to get up slowly.  Rise slowly from chairs and steady yourself until you feel okay.  In the morning, first sit up on the side of the bed. When you feel okay, stand slowly while holding onto something until you know your balance is fine.  Move your legs often if you need to stand in one place for a long time. Tighten and relax your muscles in your legs while standing.  Have someone stay with you for 1-2 days if dizziness continues to be a problem. Do this until you feel you are well enough to stay alone. Have the person call your health care provider if he or she notices changes in you that are concerning.  Do not drive or use heavy machinery if you feel dizzy.  Do not drink alcohol. SEEK IMMEDIATE MEDICAL CARE IF:   Your dizziness or light-headedness gets worse.  You feel nauseous or vomit.  You have problems talking, walking, or using your arms, hands, or legs.  You feel weak.  You are not thinking clearly or you have trouble forming sentences. It may take a friend or family member to notice this.  You have chest pain, abdominal  pain, shortness of breath, or sweating.  Your vision changes.  You notice any bleeding.  You have side effects from medicine that seems to be getting worse rather than better. MAKE SURE YOU:   Understand these instructions.  Will watch your condition.  Will get help right away if you are not doing well or get worse. Document  Released: 09/26/2000 Document Revised: 04/07/2013 Document Reviewed: 10/20/2010 [facility] Patient Information 2015 [place], [place]. This information is not intended to replace advice given to you by your health care provider. Make sure you discuss any questions you have with your health care provider.

## 2014-09-08 ENCOUNTER — Ambulatory Visit: Payer: BC Managed Care – PPO | Admitting: Family Medicine

## 2014-12-09 ENCOUNTER — Telehealth: Payer: Self-pay

## 2014-12-09 NOTE — Telephone Encounter (Signed)
Pt would like to start her imitrex again for migraines. Does she need to come in for OV or can we write Rx?

## 2014-12-10 MED ORDER — SUMATRIPTAN SUCCINATE 100 MG PO TABS: 100.0000 mg | ORAL_TABLET | ORAL | Status: DC | PRN

## 2014-12-10 NOTE — Telephone Encounter (Signed)
Please call or fax in rx for Imitrex 100mg  as approved and advise pt.

## 2014-12-11 NOTE — Telephone Encounter (Signed)
Called in Rx to [facility], [place]. Notified pt on voicemail.

## 2015-01-20 ENCOUNTER — Inpatient Hospital Stay: Payer: BC Managed Care – PPO | Attending: Internal Medicine

## 2015-01-20 ENCOUNTER — Encounter: Payer: Self-pay | Admitting: Internal Medicine

## 2015-01-20 ENCOUNTER — Inpatient Hospital Stay (HOSPITAL_BASED_OUTPATIENT_CLINIC_OR_DEPARTMENT_OTHER): Payer: BC Managed Care – PPO | Admitting: Internal Medicine

## 2015-01-20 ENCOUNTER — Other Ambulatory Visit: Payer: Self-pay | Admitting: *Deleted

## 2015-01-20 VITALS — BP 125/62 | HR 73 | Temp 96.8°F | Resp 18 | Ht 71.0 in | Wt 173.0 lb

## 2015-01-20 DIAGNOSIS — Z853 Personal history of malignant neoplasm of breast: Secondary | ICD-10-CM | POA: Diagnosis not present

## 2015-01-20 DIAGNOSIS — Z9221 Personal history of antineoplastic chemotherapy: Secondary | ICD-10-CM

## 2015-01-20 DIAGNOSIS — Z806 Family history of leukemia: Secondary | ICD-10-CM

## 2015-01-20 DIAGNOSIS — C50911 Malignant neoplasm of unspecified site of right female breast: Secondary | ICD-10-CM

## 2015-01-20 DIAGNOSIS — Z8049 Family history of malignant neoplasm of other genital organs: Secondary | ICD-10-CM | POA: Diagnosis not present

## 2015-01-20 DIAGNOSIS — Z7982 Long term (current) use of aspirin: Secondary | ICD-10-CM | POA: Diagnosis not present

## 2015-01-20 DIAGNOSIS — Z8669 Personal history of other diseases of the nervous system and sense organs: Secondary | ICD-10-CM | POA: Diagnosis not present

## 2015-01-20 DIAGNOSIS — Z9010 Acquired absence of unspecified breast and nipple: Secondary | ICD-10-CM | POA: Insufficient documentation

## 2015-01-20 DIAGNOSIS — Z79899 Other long term (current) drug therapy: Secondary | ICD-10-CM

## 2015-01-20 DIAGNOSIS — Z23 Encounter for immunization: Secondary | ICD-10-CM | POA: Insufficient documentation

## 2015-01-20 DIAGNOSIS — I10 Essential (primary) hypertension: Secondary | ICD-10-CM

## 2015-01-20 DIAGNOSIS — E785 Hyperlipidemia, unspecified: Secondary | ICD-10-CM | POA: Diagnosis not present

## 2015-01-20 LAB — CBC WITH DIFFERENTIAL/PLATELET
Basophils Absolute: 0.1 10*3/uL (ref 0–0.1)
Basophils Relative: 1 %
EOS ABS: 0.2 10*3/uL (ref 0–0.7)
EOS PCT: 5 %
HCT: 39.1 % (ref 35.0–47.0)
Hemoglobin: 13.1 g/dL (ref 12.0–16.0)
LYMPHS ABS: 1.2 10*3/uL (ref 1.0–3.6)
Lymphocytes Relative: 27 %
MCH: 30.7 pg (ref 26.0–34.0)
MCHC: 33.6 g/dL (ref 32.0–36.0)
MCV: 91.3 fL (ref 80.0–100.0)
MONO ABS: 0.4 10*3/uL (ref 0.2–0.9)
Monocytes Relative: 9 %
Neutro Abs: 2.6 10*3/uL (ref 1.4–6.5)
Neutrophils Relative %: 58 %
PLATELETS: 362 10*3/uL (ref 150–440)
RBC: 4.28 MIL/uL (ref 3.80–5.20)
RDW: 12.8 % (ref 11.5–14.5)
WBC: 4.5 10*3/uL (ref 3.6–11.0)

## 2015-01-20 LAB — HEPATIC FUNCTION PANEL
ALK PHOS: 60 U/L (ref 38–126)
ALT: 15 U/L (ref 14–54)
AST: 18 U/L (ref 15–41)
Albumin: 4.9 g/dL (ref 3.5–5.0)
BILIRUBIN TOTAL: 0.7 mg/dL (ref 0.3–1.2)
TOTAL PROTEIN: 8.2 g/dL — AB (ref 6.5–8.1)

## 2015-01-20 LAB — CREATININE, SERUM
CREATININE: 0.61 mg/dL (ref 0.44–1.00)
GFR calc Af Amer: >60 mL/min (ref >60)
GFR calc non Af Amer: >60 mL/min (ref >60)

## 2015-01-20 MED ORDER — INFLUENZA VAC SPLIT QUAD 0.5 ML IM SUSY
0.5000 mL | PREFILLED_SYRINGE | Freq: Once | INTRAMUSCULAR | Status: AC
  Administered 2015-01-20: 0.5 mL via INTRAMUSCULAR
  Filled 2015-01-20: qty 0.5

## 2015-01-20 NOTE — Progress Notes (Signed)
Pt had her last mammogram in May in [place] with her PCP. She completed femara in may 2016. She has some joint aches and that went away , she now has lots of energy and she is back to experiencing migraines.

## 2015-01-20 NOTE — Progress Notes (Signed)
[place]  [phone]) [phone] [phone]     ID: [name] OB: 21-Aug-1950  MR#: [id number]  IPJ#:[id number]  Patient Care Team: Wardell Honour, MD as PCP - General (Family Medicine)  CHIEF COMPLAINT/DIAGNOSIS:  Right breast invasive ductal carcinoma, high grade (T2N1M0, clinical stage IIB). Had lumpectomy and axillary node dissection in October, followed by simple mastectomy on February 25, 2007. Tumor size 2.2 cm, 1 of 2 sentinel nodes positive for metastasis, otherwise 11 lymph nodes negative on axillary dissection. ER/PR positive and HER-2/neu positive.   Patient completed AC x4 chemotherapy followed by 12 weeks of Taxol therapy (last dose Sep 02, 2007).  Completed one year of Herceptin treatment on May 26, 2008.  Started adjuvant hormone therapy with Femara in May 2009. Dexa scan May 2012 unremarkable, T score of -1.2.   HISTORY OF PRESENT ILLNESS:  Patient returns for continued oncology followup, she was seen one year ago. States that she doing steady and denies new complaints. Denies feeling any new breast masses on self exam. She has completed Femara therapy in May 2016, denies any hot flashes or joint pains. No new dyspnea or orthopnea. No new cough, chest pain or hemoptysis. Denies history of osteoporosis.  No new mood disturbances. Physically active.    REVIEW OF SYSTEMS:   ROS As in HPI above. In addition, no fever, chills or sweats. No new headaches or focal weakness.  No new mood disturbances. No  sore throat, cough, shortness of breath, sputum, hemoptysis or chest pain. No dizziness or palpitation. No abdominal pain, constipation, diarrhea, dysuria or hematuria. No new skin rash or bleeding symptoms. No new paresthesias in extremities. PS ECOG 0.  PAST MEDICAL HISTORY: Reviewed. Past Medical History  Diagnosis Date  . Hypertension   . Cancer ([place])     Breast Stage IIx2000  . Hyperlipidemia   . Allergy     seasonal  . Migraines            Lumpectomy and axillary node dissection for T2 N1 right breast cancer, high grade October 2008  DCIS right breast lumpectomy in 2000  Left Breast Microcalcifications October 2009 - had biopsy in [place] in 2009, reported benign.  PAST SURGICAL HISTORY: Reviewed. Past Surgical History  Procedure Laterality Date  . Mastectomy modified radical  2008  . Abdominal hysterectomy  03/16/2012    uterine prolapse.  Ovaries resected.   [facility].  . Colonoscopy  04/17/2007    normal; repeat in 10 years.  [facility].  . Colpopexy  05/17/13    [facility]    FAMILY HISTORY: Reviewed. Family History  Problem Relation Age of Onset  . Stroke Mother     TIAs in 33s.  . Hypertension Mother   . Heart disease Father 28    AMI age 7  . Hypertension Father   . Drug abuse Sister   . Cancer Sister     CLL  . Cervical cancer Sister     SOCIAL HISTORY: Reviewed. Social History  Substance Use Topics  . Smoking status: Never Smoker   . Smokeless tobacco: Never Used  . Alcohol Use: None    Allergies  Allergen Reactions  . Codeine Other (See Comments)    hallunication  . Eucalyptus Flavor [Flavoring Agent] Other (See Comments)    Causes difficulty in breathing    Current Outpatient Prescriptions  Medication Sig Dispense Refill  . aspirin 81 MG tablet Take 81 mg by mouth daily.    . Cholecalciferol (VITAMIN D-3  PO) Take 1,000 Units by mouth daily.    . fluticasone (FLONASE) 50 MCG/ACT nasal spray 2 sprays into each nostril daily. 16 g 11  . hydrochlorothiazide (HYDRODIURIL) 25 MG tablet Take 1 tablet (25 mg total) by mouth daily. 90 tablet 3  . lisinopril (PRINIVIL,ZESTRIL) 10 MG tablet Take 1 tablet (10 mg total) by mouth daily. 90 tablet 3  . meclizine (ANTIVERT) 12.5 MG tablet Take 1 tablet (12.5 mg total) by mouth 3 (three) times daily as needed for dizziness or nausea. 30 tablet 0  . OVER THE COUNTER MEDICATION OTC Nasal Saline spray taking    . polyethylene glycol powder  (MIRALAX) powder Take 17 g by mouth.     . simvastatin (ZOCOR) 20 MG tablet Take 0.5 tablets (10 mg total) by mouth every evening. 90 tablet 3  . SUMAtriptan (IMITREX) 100 MG tablet Take 1 tablet (100 mg total) by mouth every 2 (two) hours as needed for migraine. May repeat in 2 hours once. 8 tablet 5  . cetirizine (ZYRTEC) 10 MG tablet Take 1 tablet (10 mg total) by mouth daily. (Patient taking differently: Take 10 mg by mouth daily as needed. ) 30 tablet 5   No current facility-administered medications for this visit.    PHYSICAL EXAM: Filed Vitals:   01/20/15 1001  BP: 125/62  Pulse: 73  Temp: 96.8 F (36 C)  Resp: 18     Body mass index is 24.14 kg/(m^2).    ECOG FS:0 - Asymptomatic  GENERAL: Patient is alert and oriented and in no acute distress. There is no icterus. HEENT: EOMs intact. No cervical lymphadenopathy. CVS: S1S2, regular LUNGS: Bilaterally clear to auscultation, no rhonchi. ABDOMEN: Soft, nontender. No hepatomegaly clinically.  NEURO: grossly nonfocal, cranial nerves are intact.   EXTREMITIES: No pedal edema. BREASTS:  Right mastectomy site negative for any masses. No masses or tenderness in the left breast. No axillary adenopathy on either side. Exam performed in the presence of a nurse.    LAB RESULTS:    Component Value Date/Time   NA 138 09/05/2014 0912   NA 138 05/16/2012 0453   NA 141 02/08/2012   K 3.9 09/05/2014 0912   K 3.5 05/16/2012 0453   CL 97 09/05/2014 0912   CL 102 05/16/2012 0453   CO2 31 09/05/2014 0912   CO2 29 05/16/2012 0453   GLUCOSE 95 09/05/2014 0912   GLUCOSE 125* 05/16/2012 0453   BUN 8 09/05/2014 0912   BUN 8 05/16/2012 0453   BUN 9 02/08/2012   CREATININE 0.61 01/20/2015 0936   CREATININE 0.73 09/05/2014 0912   CREATININE 0.81 01/14/2014 0955   CREATININE 0.7 02/08/2012   CALCIUM 10.0 09/05/2014 0912   CALCIUM 8.3* 05/16/2012 0453   PROT 8.2* 01/20/2015 0936   PROT 8.0 01/14/2014 0955   ALBUMIN 4.9 01/20/2015 0936    ALBUMIN 4.3 01/14/2014 0955   AST 18 01/20/2015 0936   AST 22 01/14/2014 0955   ALT 15 01/20/2015 0936   ALT 25 01/14/2014 0955   ALKPHOS 60 01/20/2015 0936   ALKPHOS 69 01/14/2014 0955   BILITOT 0.7 01/20/2015 0936   BILITOT 0.6 01/14/2014 0955   GFRNONAA >60 01/20/2015 0936   GFRNONAA >89 03/10/2014 0846   GFRNONAA >60 01/14/2014 0955   GFRNONAA >60 01/13/2013 1004   GFRAA >60 01/20/2015 0936   GFRAA >89 03/10/2014 0846   GFRAA >60 01/14/2014 0955   GFRAA >60 01/13/2013 1004    Lab Results  Component Value Date   WBC  4.5 01/20/2015   NEUTROABS 2.6 01/20/2015   HGB 13.1 01/20/2015   HCT 39.1 01/20/2015   MCV 91.3 01/20/2015   PLT 362 01/20/2015   Serum CA 27.29 pending (was 19.4 in Oct 2015).   STUDIES: [place] 2014 - DEXA scan unremarkable (T-score -0.7)  09/01/14 - Left Mammogram.  IMPRESSION: No mammographic evidence of malignancy. RECOMMENDATION: Screening mammogram in one year. (Code:SM-B-01Y).  BI-RADS CATEGORY 1: Negative.   ASSESSMENT / PLAN:   1. History of Right breast cancer as described above -  Reviewed labs and d/w patient. She is doing well with no clinical evidence to suggest recurrent or metastatic breast cancer. Serum CA 27-29 level is pending. Left breast mammogram in May was reported unremarkable, BIRADS-1. She has completed planned course of hormonal therapy with Femara 2.5 mg in May 2016. Plan is continued surveillance, next MD followup here in one year with repeat labs including CA 27.29 level. .   2. Osteoporosis surveillance - states being managed by PMD Dr.Krisit Tamala Julian and is on Vit-D. Last DEXA scan in 2014 was unremarkable, recommend monitoring at 3 years from last one.  3. Schedule next left breast annual surveillance mammogram here in [place] in May 2017. 4. In between visits, the patient has been advised to call in case of any new breast masses or chest wall masses felt on self exam or other new symptoms and will need to be evaluated sooner.  Patient is agreeable to this plan.    Leia Alf, MD   01/20/2015 10:50 AM

## 2015-01-21 LAB — CANCER ANTIGEN 27.29: CA 27.29: 25 U/mL (ref 0.0–38.6)

## 2015-02-22 IMAGING — US US EXTREM LOW VENOUS*L*
1 series · 14 of 24 positions shown · non-contrast
Comparison: none

REASON FOR EXAM: Call Report [phone]  left leg swelling    post surg
eval for DVT
COMMENTS:

[Series 1: us extrem low venous*left* · 0.07mm/px · 14 of 24 slices shown]
[im 1/24]
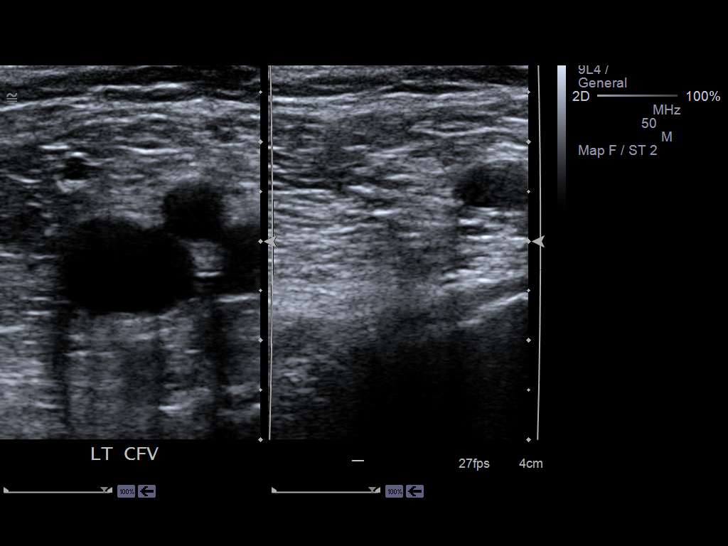
[im 3/24]
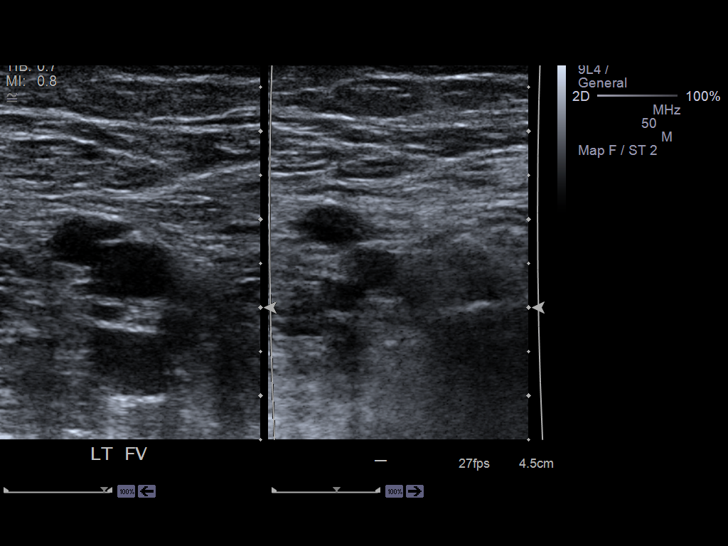
[im 5/24]
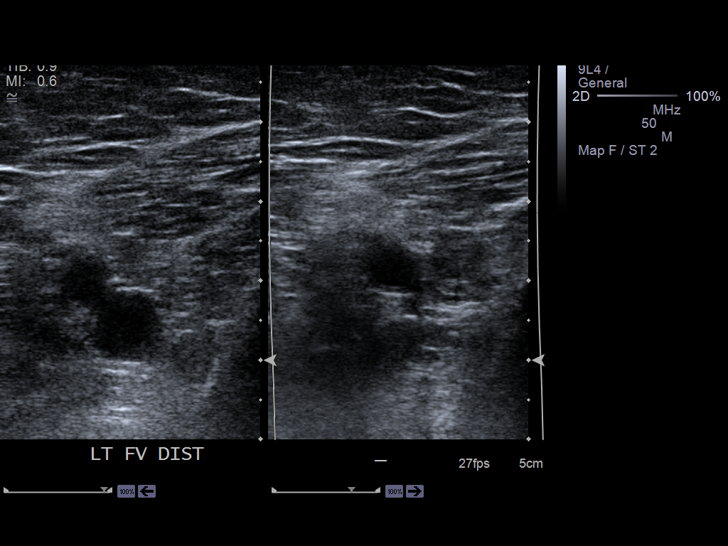
[im 7/24]
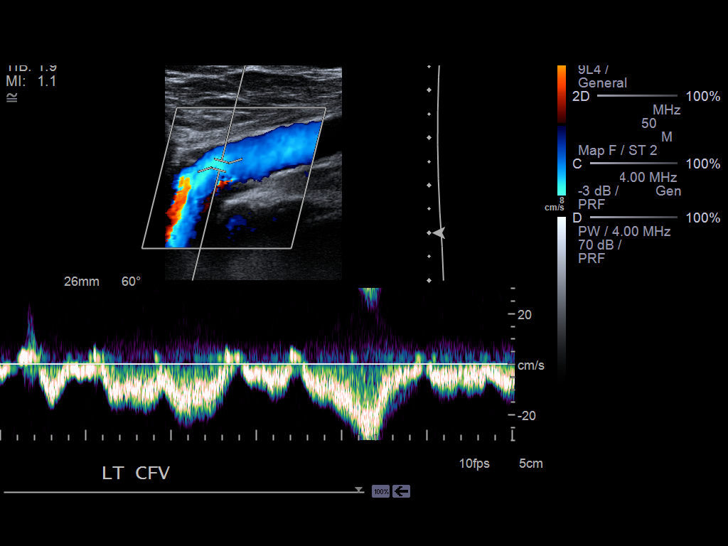
[im 8/24]
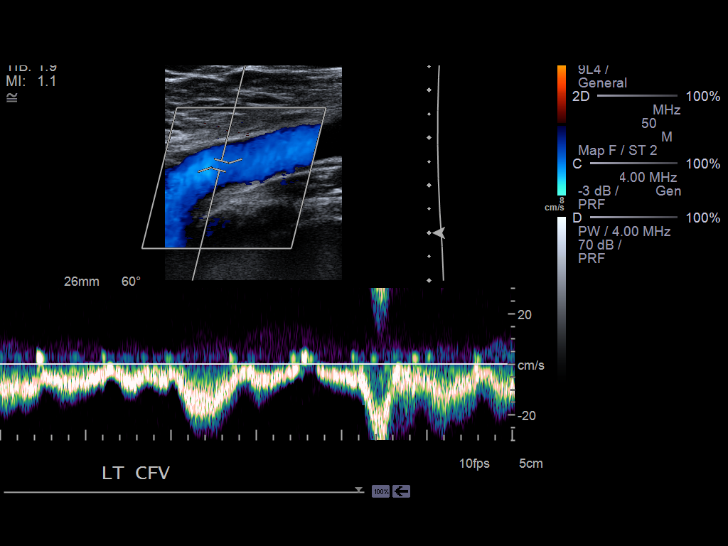
[im 10/24]
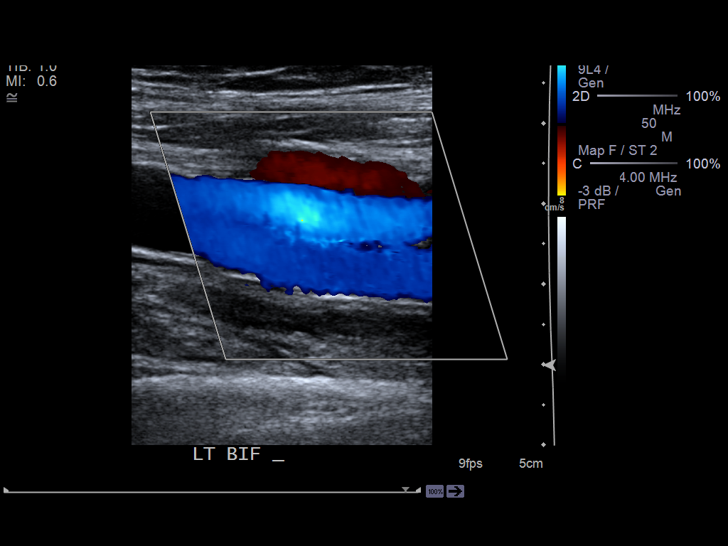
[im 12/24]
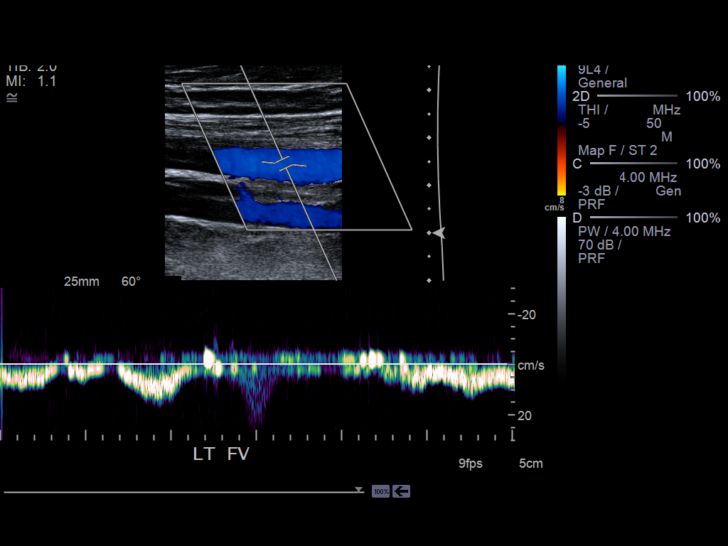
[im 13/24]
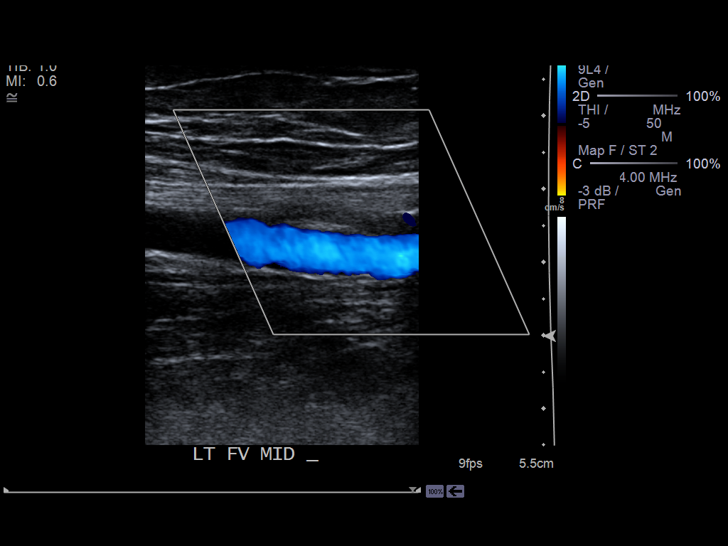
[im 15/24]
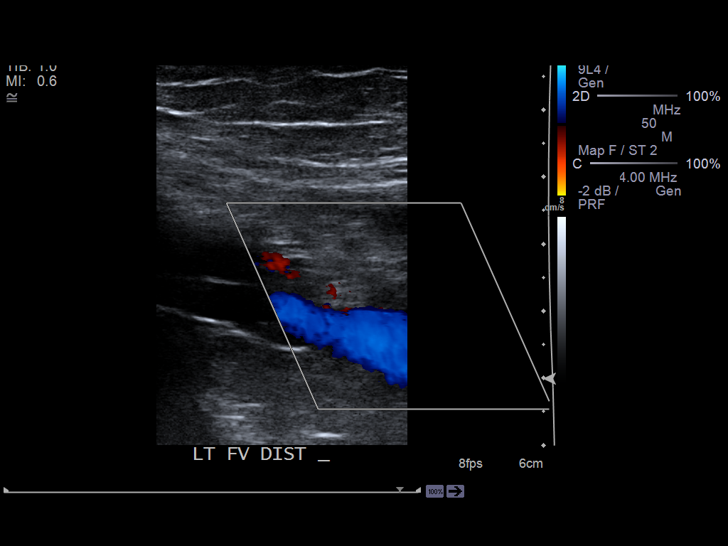
[im 17/24]
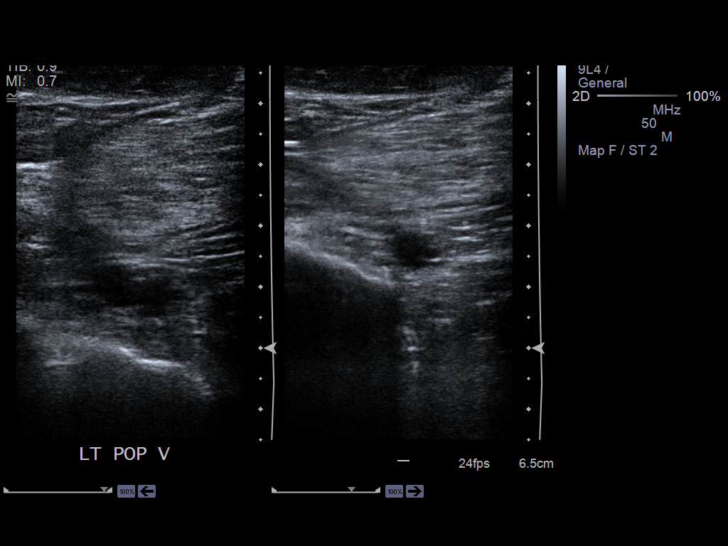
[im 19/24]
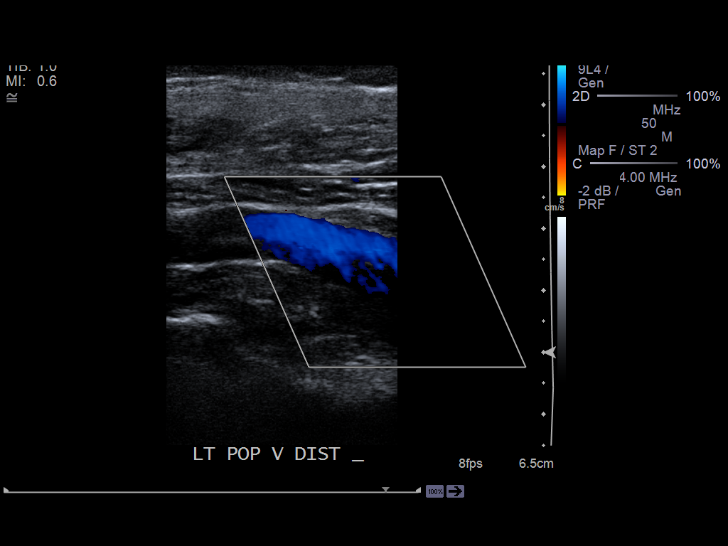
[im 20/24]
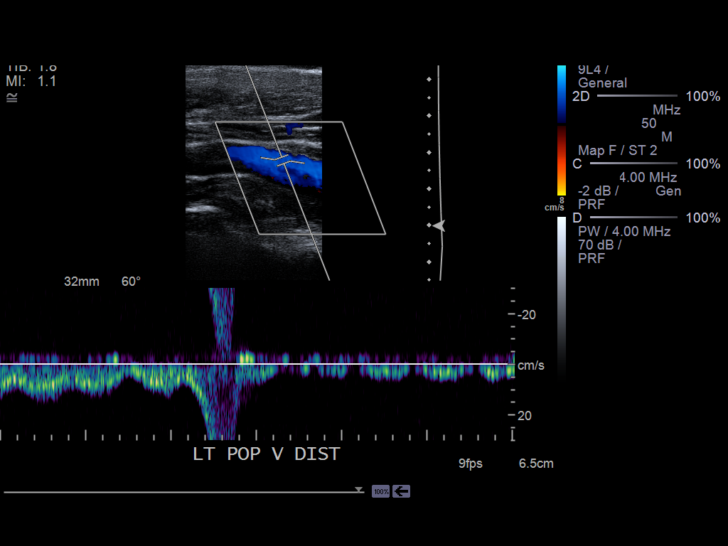
[im 22/24]
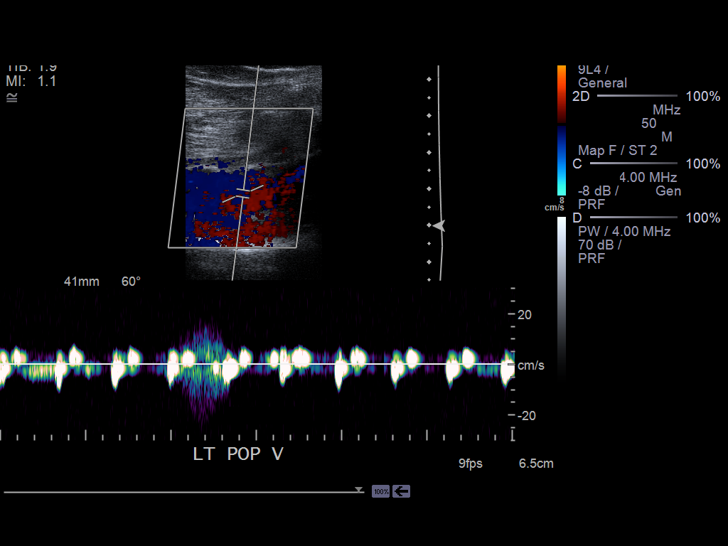
[im 24/24]
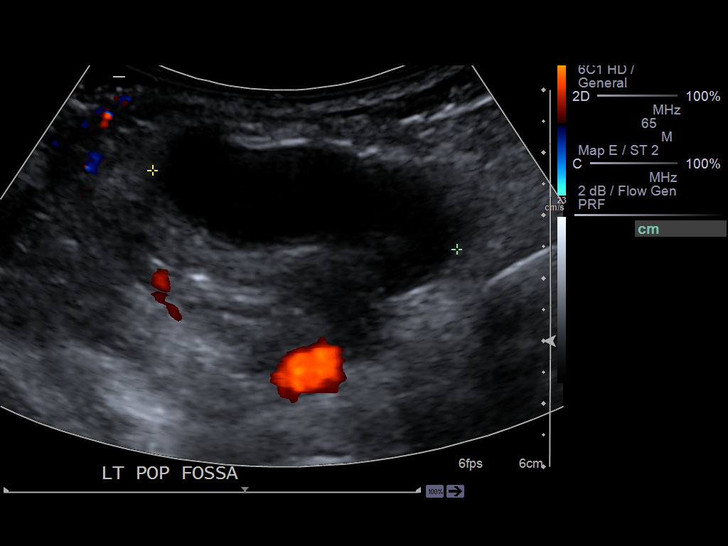

[14 of 24 positions shown; findings below may reference images not displayed]

PROCEDURE:     US  - US DOPPLER LOW EXTR LEFT  - August 11, 2012  [DATE]

RESULT:     Grayscale and color flow Doppler techniques were employed to
evaluate the deep veins of the left lower extremity.

The left common femoral, superficial femoral, and popliteal veins are
normally compressible. The waveform patterns are normal and the color flow
images are normal. The response to the augmentation and Valsalva maneuvers
is normal. There is an ovoid popliteal fossa fluid collection consistent
with a Baker's cyst measuring 6.8 x 1.9 x 5 cm.
IMPRESSION: 1. There is no evidence of thrombus within the left femoral or popliteal
veins.
2. There is a popliteal or Baker's cy[REDACTED]

## 2015-03-09 ENCOUNTER — Encounter: Payer: Self-pay | Admitting: Family Medicine

## 2015-03-18 ENCOUNTER — Ambulatory Visit (INDEPENDENT_AMBULATORY_CARE_PROVIDER_SITE_OTHER): Payer: BC Managed Care – PPO | Admitting: Family Medicine

## 2015-03-18 ENCOUNTER — Encounter: Payer: Self-pay | Admitting: Family Medicine

## 2015-03-18 VITALS — BP 133/81 | HR 78 | Temp 98.0°F | Resp 16 | Ht 70.5 in | Wt 170.0 lb

## 2015-03-18 DIAGNOSIS — Z114 Encounter for screening for human immunodeficiency virus [HIV]: Secondary | ICD-10-CM

## 2015-03-18 DIAGNOSIS — Z1159 Encounter for screening for other viral diseases: Secondary | ICD-10-CM | POA: Diagnosis not present

## 2015-03-18 DIAGNOSIS — I10 Essential (primary) hypertension: Secondary | ICD-10-CM

## 2015-03-18 DIAGNOSIS — Z0000 Encounter for general adult medical examination without abnormal findings: Secondary | ICD-10-CM | POA: Diagnosis not present

## 2015-03-18 DIAGNOSIS — J301 Allergic rhinitis due to pollen: Secondary | ICD-10-CM | POA: Diagnosis not present

## 2015-03-18 DIAGNOSIS — K5901 Slow transit constipation: Secondary | ICD-10-CM | POA: Diagnosis not present

## 2015-03-18 DIAGNOSIS — C50911 Malignant neoplasm of unspecified site of right female breast: Secondary | ICD-10-CM | POA: Diagnosis not present

## 2015-03-18 DIAGNOSIS — E7800 Pure hypercholesterolemia, unspecified: Secondary | ICD-10-CM

## 2015-03-18 DIAGNOSIS — R7302 Impaired glucose tolerance (oral): Secondary | ICD-10-CM | POA: Diagnosis not present

## 2015-03-18 LAB — CBC WITH DIFFERENTIAL/PLATELET
BASOS PCT: 2 % — AB (ref 0–1)
Basophils Absolute: 0.1 10*3/uL (ref 0.0–0.1)
EOS ABS: 0.2 10*3/uL (ref 0.0–0.7)
Eosinophils Relative: 4 % (ref 0–5)
HCT: 36.7 % (ref 36.0–46.0)
Hemoglobin: 12.9 g/dL (ref 12.0–15.0)
LYMPHS ABS: 1.2 10*3/uL (ref 0.7–4.0)
Lymphocytes Relative: 30 % (ref 12–46)
MCH: 31.2 pg (ref 26.0–34.0)
MCHC: 35.1 g/dL (ref 30.0–36.0)
MCV: 88.9 fL (ref 78.0–100.0)
MONO ABS: 0.4 10*3/uL (ref 0.1–1.0)
MONOS PCT: 11 % (ref 3–12)
MPV: 9.7 fL (ref 8.6–12.4)
Neutro Abs: 2.1 10*3/uL (ref 1.7–7.7)
Neutrophils Relative %: 53 % (ref 43–77)
PLATELETS: 418 10*3/uL — AB (ref 150–400)
RBC: 4.13 MIL/uL (ref 3.87–5.11)
RDW: 13.1 % (ref 11.5–15.5)
WBC: 3.9 10*3/uL — ABNORMAL LOW (ref 4.0–10.5)

## 2015-03-18 LAB — POCT URINALYSIS DIP (MANUAL ENTRY)
BILIRUBIN UA: NEGATIVE
Bilirubin, UA: NEGATIVE
Glucose, UA: NEGATIVE
Nitrite, UA: NEGATIVE
PH UA: 7
PROTEIN UA: NEGATIVE
RBC UA: NEGATIVE
SPEC GRAV UA: 1.015
Urobilinogen, UA: 0.2

## 2015-03-18 LAB — HEPATITIS C ANTIBODY: HCV Ab: NEGATIVE

## 2015-03-18 LAB — LIPID PANEL
CHOL/HDL RATIO: 2.4 ratio (ref <=5.0)
Cholesterol: 174 mg/dL (ref 125–200)
HDL: 72 mg/dL (ref >=46)
LDL Cholesterol: 88 mg/dL (ref <130)
TRIGLYCERIDES: 69 mg/dL (ref <150)
VLDL: 14 mg/dL (ref <30)

## 2015-03-18 LAB — COMPREHENSIVE METABOLIC PANEL
ALBUMIN: 4.7 g/dL (ref 3.6–5.1)
ALT: 12 U/L (ref 6–29)
AST: 17 U/L (ref 10–35)
Alkaline Phosphatase: 61 U/L (ref 33–130)
BUN: 8 mg/dL (ref 7–25)
CHLORIDE: 94 mmol/L — AB (ref 98–110)
CO2: 29 mmol/L (ref 20–31)
CREATININE: 0.71 mg/dL (ref 0.50–0.99)
Calcium: 9.5 mg/dL (ref 8.6–10.4)
Glucose, Bld: 96 mg/dL (ref 65–99)
POTASSIUM: 3.9 mmol/L (ref 3.5–5.3)
SODIUM: 134 mmol/L — AB (ref 135–146)
TOTAL PROTEIN: 7.9 g/dL (ref 6.1–8.1)
Total Bilirubin: 0.7 mg/dL (ref 0.2–1.2)

## 2015-03-18 LAB — HEMOGLOBIN A1C
HEMOGLOBIN A1C: 6 % — AB (ref <5.7)
MEAN PLASMA GLUCOSE: 126 mg/dL — AB (ref <117)

## 2015-03-18 LAB — TSH: TSH: 1.978 u[IU]/mL (ref 0.350–4.500)

## 2015-03-18 MED ORDER — FLUTICASONE PROPIONATE 50 MCG/ACT NA SUSP: NASAL | Status: DC

## 2015-03-18 MED ORDER — LISINOPRIL 10 MG PO TABS: 10.0000 mg | ORAL_TABLET | Freq: Every day | ORAL | Status: DC

## 2015-03-18 MED ORDER — SIMVASTATIN 10 MG PO TABS: 10.0000 mg | ORAL_TABLET | Freq: Every evening | ORAL | Status: DC

## 2015-03-18 MED ORDER — HYDROCHLOROTHIAZIDE 25 MG PO TABS: 25.0000 mg | ORAL_TABLET | Freq: Every day | ORAL | Status: DC

## 2015-03-18 MED ORDER — SUMATRIPTAN SUCCINATE 100 MG PO TABS: 50.0000 mg | ORAL_TABLET | ORAL | Status: AC | PRN

## 2015-03-18 NOTE — Patient Instructions (Signed)

## 2015-03-18 NOTE — Progress Notes (Signed)
Subjective:    Patient ID: [name], female    DOB: 25-Sep-1950, 64 y.o.   MRN: [id number]  03/18/2015  Annual Exam and Medication Refill   HPI This 64 y.o. female presents for Complete Physical Examination.  Last physical:  03-10-2014 Pap smear: Mammogram:  09-01-2014 Colonoscopy:  2009;  Bone density:  2012 TDAP:  2010 Pneumovax: never Zostavax:  2013 Influenza:  01-20-2015 Eye exam:  05/2014; +glasses; [facility].  Referred due to cupping abnormality.  +eary cataracts. Dental exam:  Not recent; 1 year. [facility] in [place].   HTN: Patient reports good compliance with medication, good tolerance to medication, and good symptom control.  Not checking BP at home.  Hyperlipidemia: Patient reports good compliance with medication, good tolerance to medication, and good symptom control.    Glucose Intolerance:   Breast cancer:  Completed Femara 08/2014.  Joint aches have resolved off of Femara.  Migraines have recurred since stopping Femara.   Right breast invasive ductal carcinoma, high grade (T2N1M0, clinical stage IIB). Had lumpectomy and axillary node dissection in October, followed by simple mastectomy on February 25, 2007. Tumor size 2.2 cm, 1 of 2 sentinel nodes positive for metastasis, otherwise 11 lymph nodes negative on axillary dissection. ER/PR positive and HER-2/neu positive.  Patient completed AC x4 chemotherapy followed by 12 weeks of Taxol therapy (last dose Sep 02, 2007). Completed one year of Herceptin treatment on May 26, 2008.  Started adjuvant hormone therapy with Femara in May 2009. Dexa scan May 2012 unremarkable, T score of -1.2.  Constipation: taking Miralax daily most days.   Review of Systems  Constitutional: Negative for fever, chills, diaphoresis, activity change, appetite change, fatigue and unexpected weight change.  HENT: Negative for congestion, dental problem, drooling, ear discharge, ear pain, facial swelling, hearing loss, mouth sores,  nosebleeds, postnasal drip, rhinorrhea, sinus pressure, sneezing, sore throat, tinnitus, trouble swallowing and voice change.   Eyes: Negative for photophobia, pain, discharge, redness, itching and visual disturbance.  Respiratory: Negative for apnea, cough, choking, chest tightness, shortness of breath, wheezing and stridor.   Cardiovascular: Negative for chest pain, palpitations and leg swelling.  Gastrointestinal: Negative for nausea, vomiting, abdominal pain, diarrhea, constipation, blood in stool, abdominal distention, anal bleeding and rectal pain.  Endocrine: Negative for cold intolerance, heat intolerance, polydipsia, polyphagia and polyuria.  Genitourinary: Negative for dysuria, urgency, frequency, hematuria, flank pain, decreased urine volume, vaginal bleeding, vaginal discharge, enuresis, difficulty urinating, genital sores, vaginal pain, menstrual problem, pelvic pain and dyspareunia.  Musculoskeletal: Negative for myalgias, back pain, joint swelling, arthralgias, gait problem, neck pain and neck stiffness.  Skin: Negative for color change, pallor, rash and wound.  Allergic/Immunologic: Negative for environmental allergies, food allergies and immunocompromised state.  Neurological: Negative for dizziness, tremors, seizures, syncope, facial asymmetry, speech difficulty, weakness, light-headedness, numbness and headaches.  Hematological: Negative for adenopathy. Does not bruise/bleed easily.  Psychiatric/Behavioral: Negative for suicidal ideas, hallucinations, behavioral problems, confusion, sleep disturbance, self-injury, dysphoric mood, decreased concentration and agitation. The patient is not nervous/anxious and is not hyperactive.     Past Medical History  Diagnosis Date  . Hypertension   . Cancer ([place])     Breast Stage IIx2000  . Hyperlipidemia   . Allergy     seasonal  . Migraines    Past Surgical History  Procedure Laterality Date  . Mastectomy modified radical  2008  .  Colonoscopy  04/17/2007    normal; repeat in 10 years.  [facility]  . [facility]xy  05/17/13  [facility]  . Abdominal hysterectomy  03/16/2012    uterine prolapse.  Ovaries resected.   [facility].   Allergies  Allergen Reactions  . Codeine Other (See Comments)    hallunication  . Eucalyptus Flavor [Flavoring Agent] Other (See Comments)    Causes difficulty in breathing   Current Outpatient Prescriptions  Medication Sig Dispense Refill  . aspirin 81 MG tablet Take 81 mg by mouth daily.    . Cholecalciferol (VITAMIN D-3 PO) Take 1,000 Units by mouth daily.    . fluticasone (FLONASE) 50 MCG/ACT nasal spray 2 sprays into each nostril daily. 16 g 11  . hydrochlorothiazide (HYDRODIURIL) 25 MG tablet Take 1 tablet (25 mg total) by mouth daily. 90 tablet 3  . lisinopril (PRINIVIL,ZESTRIL) 10 MG tablet Take 1 tablet (10 mg total) by mouth daily. 90 tablet 3  . meclizine (ANTIVERT) 12.5 MG tablet Take 1 tablet (12.5 mg total) by mouth 3 (three) times daily as needed for dizziness or nausea. 30 tablet 0  . polyethylene glycol powder (MIRALAX) powder Take 17 g by mouth.     . simvastatin (ZOCOR) 10 MG tablet Take 1 tablet (10 mg total) by mouth every evening. 90 tablet 3  . SUMAtriptan (IMITREX) 100 MG tablet Take 0.5 tablets (50 mg total) by mouth every 2 (two) hours as needed for migraine. May repeat in 2 hours once. 8 tablet 5   No current facility-administered medications for this visit.   Social History   Social History  . Marital Status: Married    Spouse Name: N/A  . Number of Children: 1  . Years of Education: N/A   Occupational History  . 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 Smoking status: Never Smoker   . Smokeless tobacco: Never Used  . Alcohol Use: No  . Drug Use: No  . Sexual Activity: Yes    Birth Control/ Protection: Post-menopausal, Surgical   Other Topics  Concern  . Not on file   Social History Narrative   Marital status:  Married x 35 years; happily married; no abuse.      Children: one daughter (36) [place]; no grandchildren.      Lives: with husband in [place].      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  Working at [employer] 20 hours per week.      Tobacco:  Never.      Alcohol: none      Drugs: none      Exercise:  Job very physical; walking during the week.  Walking on walking track 3 days per week.      Seatbelt:  100%      Guns: loaded; unsecured.      Sunscreen:  None; rare sun exposure.   Family History  Problem Relation Age of Onset  . Stroke Mother     TIAs in 35s.  . Hypertension Mother   . Heart disease Father 44    AMI age 72  . Hypertension Father   . Drug abuse Sister   . Cancer Sister     CLL  . Cervical cancer Sister   . Cancer Sister     Cervical cancer       Objective:    BP 133/81 mmHg  Pulse 78  Temp(Src) 98 F (36.7 C) (Oral)  Resp 16  Ht 5' 10.5" (1.791 m)  Wt 170 lb (77.111 kg)  BMI 24.04 kg/m2 Physical Exam  Constitutional: She is oriented to person, place, and time. She appears well-developed and well-nourished. No distress.  HENT:  Head: Normocephalic and atraumatic.  Right Ear: External ear normal.  Left Ear: External ear normal.  Nose: Nose normal.  Mouth/Throat: Oropharynx is clear and moist.  Eyes: Conjunctivae and EOM are normal. Pupils are equal, round, and reactive to light.  Neck: Normal range of motion and full passive range of motion without pain. Neck supple. No JVD present. Carotid bruit is not present. No thyromegaly present.  Cardiovascular: Normal rate, regular rhythm and normal heart sounds.  Exam reveals no gallop and no friction rub.   No murmur heard. Pulmonary/Chest: Effort normal and breath sounds normal. She has no wheezes. She has no rales.  Abdominal: Soft. Bowel sounds are normal. She exhibits no distension and no mass. There is no  tenderness. There is no rebound and no guarding.  Musculoskeletal:       Right shoulder: Normal.       Left shoulder: Normal.       Cervical back: Normal.  Lymphadenopathy:    She has no cervical adenopathy.  Neurological: She is alert and oriented to person, place, and time. She has normal reflexes. No cranial nerve deficit. She exhibits normal muscle tone. Coordination normal.  Skin: Skin is warm and dry. No rash noted. She is not diaphoretic. No erythema. No pallor.  Psychiatric: She has a normal mood and affect. Her behavior is normal. Judgment and thought content normal.  Nursing note and vitals reviewed.  Results for orders placed or performed in visit on 03/18/15  POCT urinalysis dipstick  Result Value Ref Range   Color, UA yellow yellow   Clarity, UA clear clear   Glucose, UA negative negative   Bilirubin, UA negative negative   Ketones, POC UA negative negative   Spec Grav, UA 1.015    Blood, UA negative negative   pH, UA 7.0    Protein Ur, POC negative negative   Urobilinogen, UA 0.2    Nitrite, UA Negative Negative   Leukocytes, UA Trace (A) Negative       Assessment & Plan:   1. Routine physical examination   2. Pure hypercholesterolemia   3. Essential hypertension, benign   4. Malignant neoplasm of right female breast, unspecified site of breast ([place])   5. Allergic rhinitis due to pollen   6. Glucose intolerance (impaired glucose tolerance)   7. Slow transit constipation   8. Screening for HIV (human immunodeficiency virus)   9. Need for hepatitis C screening test   10. Seasonal allergic rhinitis due to pollen     Orders Placed This Encounter  Procedures  . CBC with Differential/Platelet  . Comprehensive metabolic panel    Order Specific Question:  Has the patient fasted?    Answer:  Yes  . Hemoglobin A1c  . Lipid panel    Order Specific Question:  Has the patient fasted?    Answer:  Yes  . TSH  . HIV antibody  . Hepatitis C antibody  . POCT  urinalysis dipstick   Meds ordered this encounter  Medications  . SUMAtriptan (IMITREX) 100 MG tablet    Sig: Take 0.5 tablets (50 mg total) by mouth every 2 (two) hours as needed for migraine. May repeat in 2 hours once.    Dispense:  8 tablet    Refill:  5  . fluticasone (FLONASE) 50 MCG/ACT nasal spray  Sig: 2 sprays into each nostril daily.    Dispense:  16 g    Refill:  11  . hydrochlorothiazide (HYDRODIURIL) 25 MG tablet    Sig: Take 1 tablet (25 mg total) by mouth daily.    Dispense:  90 tablet    Refill:  3  . lisinopril (PRINIVIL,ZESTRIL) 10 MG tablet    Sig: Take 1 tablet (10 mg total) by mouth daily.    Dispense:  90 tablet    Refill:  3  . simvastatin (ZOCOR) 10 MG tablet    Sig: Take 1 tablet (10 mg total) by mouth every evening.    Dispense:  90 tablet    Refill:  3    Return in about 6 months (around 09/16/2015) for recheck high blood pressure, high cholesterol.    Umi Mainor Elayne Guerin, M.D. [facility] [address] [place], [place]  [address] [phone] phone [phone] fax

## 2015-03-21 LAB — HIV ANTIBODY (ROUTINE TESTING W REFLEX): HIV 1&2 Ab, 4th Generation: NONREACTIVE

## 2015-03-23 ENCOUNTER — Telehealth: Payer: Self-pay

## 2015-03-23 NOTE — Telephone Encounter (Signed)
Dr Tamala Julian   Patient went to the parade and now has a whistle sound in her ear. It is on and off.   Also, wants to discuss her lab work.   [phone]

## 2015-03-23 NOTE — Telephone Encounter (Signed)
Please review labs and do you want her to come in?

## 2015-03-24 NOTE — Telephone Encounter (Signed)
Call --- how is whistling in ear?  Is it still present?  Was she around a lot of loud noise at the parade? Is she suffering with head congestion and runny nose?

## 2015-03-27 NOTE — Telephone Encounter (Signed)
I Called her she, states the whistling has resolved, there has not been any problems with her ears today the parade was very loud. She will let us know if it returns, she will call or come in.

## 2015-04-03 ENCOUNTER — Ambulatory Visit (INDEPENDENT_AMBULATORY_CARE_PROVIDER_SITE_OTHER): Payer: BC Managed Care – PPO | Admitting: Family Medicine

## 2015-04-03 VITALS — BP 126/80 | HR 77 | Temp 98.3°F | Resp 16 | Ht 71.0 in | Wt 172.4 lb

## 2015-04-03 DIAGNOSIS — R42 Dizziness and giddiness: Secondary | ICD-10-CM | POA: Diagnosis not present

## 2015-04-03 DIAGNOSIS — H9312 Tinnitus, left ear: Secondary | ICD-10-CM | POA: Diagnosis not present

## 2015-04-03 DIAGNOSIS — J0101 Acute recurrent maxillary sinusitis: Secondary | ICD-10-CM

## 2015-04-03 DIAGNOSIS — J301 Allergic rhinitis due to pollen: Secondary | ICD-10-CM

## 2015-04-03 MED ORDER — AMOXICILLIN 500 MG PO TABS
1000.0000 mg | ORAL_TABLET | Freq: Two times a day (BID) | ORAL | Status: DC
Start: 2015-04-03 — End: 2015-12-20

## 2015-04-03 NOTE — Progress Notes (Signed)
Subjective:    Patient ID: [name], female    DOB: 1950/06/03, 64 y.o.   MRN: [id number]  04/03/2015  Tinnitus   HPI This 64 y.o. female presents for evaluation of L ear ringing for two weeks, head congestion, cough.  Onset of L ear ringing since [place] Christmas Parade two weeks ago; parade was really loud.  Claris Pong was not very good; hearing fine in L ear.  Might be muffled a bit but not severe; tunnel. Eyes feel weak today.  No fever/chills/sweats.  +HA on L side mild started today.  +mild rhinorrhea; using Flonase daily.  +nasal congestion.  No PND.  +dizziness.  Mildly nausea; no vomiting.  Taking sinus pills for relief for nighttime pressure.  Will wake up dizzy during night.  Started taking sinus medication two days ago.  Not doing allergy pills.  Intermittent ringing each day; really cold days will trigger ringing.  Humidifier at home.     Review of Systems  Constitutional: Negative for fever, chills, diaphoresis and fatigue.  HENT: Positive for congestion, hearing loss, postnasal drip, rhinorrhea, sinus pressure, tinnitus and voice change. Negative for ear discharge, ear pain, sore throat and trouble swallowing.   Respiratory: Positive for cough. Negative for shortness of breath and wheezing.   Gastrointestinal: Positive for nausea. Negative for vomiting and diarrhea.  Neurological: Positive for dizziness and headaches.    Past Medical History  Diagnosis Date  . Hypertension   . Cancer ([place])     Breast Stage IIx2000  . Hyperlipidemia   . Allergy     seasonal  . Migraines    Past Surgical History  Procedure Laterality Date  . Mastectomy modified radical  2008  . Colonoscopy  04/17/2007    normal; repeat in 10 years.  [facility].  . [facility]xy  05/17/13    [facility]  . Abdominal hysterectomy  03/16/2012    uterine prolapse.  Ovaries resected.   [facility].   Allergies  Allergen Reactions  . Codeine Other (See Comments)    hallunication  . Eucalyptus Flavor  [Flavoring Agent] Other (See Comments)    Causes difficulty in breathing   Current Outpatient Prescriptions  Medication Sig Dispense Refill  . aspirin 81 MG tablet Take 81 mg by mouth daily.    . Cholecalciferol (VITAMIN D-3 PO) Take 1,000 Units by mouth daily.    . fluticasone (FLONASE) 50 MCG/ACT nasal spray 2 sprays into each nostril daily. 16 g 11  . hydrochlorothiazide (HYDRODIURIL) 25 MG tablet Take 1 tablet (25 mg total) by mouth daily. 90 tablet 3  . lisinopril (PRINIVIL,ZESTRIL) 10 MG tablet Take 1 tablet (10 mg total) by mouth daily. 90 tablet 3  . meclizine (ANTIVERT) 12.5 MG tablet Take 1 tablet (12.5 mg total) by mouth 3 (three) times daily as needed for dizziness or nausea. 30 tablet 0  . polyethylene glycol powder (MIRALAX) powder Take 17 g by mouth.     . simvastatin (ZOCOR) 10 MG tablet Take 1 tablet (10 mg total) by mouth every evening. 90 tablet 3  . SUMAtriptan (IMITREX) 100 MG tablet Take 0.5 tablets (50 mg total) by mouth every 2 (two) hours as needed for migraine. May repeat in 2 hours once. 8 tablet 5  . amoxicillin (AMOXIL) 500 MG tablet Take 2 tablets (1,000 mg total) by mouth 2 (two) times daily. 40 tablet 0   No current facility-administered medications for this visit.   Social History   Social History  . Marital Status: Married  Spouse Name: N/A  . Number of Children: 1  . Years of Education: N/A   Occupational History  . 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 Smoking status: Never Smoker   . Smokeless tobacco: Never Used  . Alcohol Use: No  . Drug Use: No  . Sexual Activity: Yes    Birth Control/ Protection: Post-menopausal, Surgical   Other Topics Concern  . Not on file   Social History Narrative   Marital status:  Married x 35 years; happily married; no abuse.      Children: one daughter (27) [place]; no grandchildren.      Lives:  with husband in [place].      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  Working at [employer] 20 hours per week.      Tobacco:  Never.      Alcohol: none      Drugs: none      Exercise:  Job very physical; walking during the week.  Walking on walking track 3 days per week.      Seatbelt:  100%      Guns: loaded; unsecured.      Sunscreen:  None; rare sun exposure.   Family History  Problem Relation Age of Onset  . Stroke Mother     TIAs in 33s.  . Hypertension Mother   . Heart disease Father 3    AMI age 34  . Hypertension Father   . Drug abuse Sister   . Cancer Sister     CLL  . Cervical cancer Sister   . Cancer Sister     Cervical cancer       Objective:    BP 126/80 mmHg  Pulse 77  Temp(Src) 98.3 F (36.8 C) (Oral)  Resp 16  Ht 5\' 11"  (1.803 m)  Wt 172 lb 6.4 oz (78.2 kg)  BMI 24.06 kg/m2  SpO2 98% Physical Exam  Constitutional: She is oriented to person, place, and time. She appears well-developed and well-nourished. No distress.  HENT:  Head: Normocephalic and atraumatic.  Right Ear: Tympanic membrane, external ear and ear canal normal.  Left Ear: Tympanic membrane, external ear and ear canal normal.  Nose: Mucosal edema and rhinorrhea present. Right sinus exhibits maxillary sinus tenderness and frontal sinus tenderness. Left sinus exhibits maxillary sinus tenderness and frontal sinus tenderness.  Mouth/Throat: Uvula is midline and mucous membranes are normal. Posterior oropharyngeal erythema present. No oropharyngeal exudate, posterior oropharyngeal edema or tonsillar abscesses.  Eyes: Conjunctivae are normal. Pupils are equal, round, and reactive to light.  Neck: Normal range of motion. Neck supple.  Cardiovascular: Normal rate, regular rhythm and normal heart sounds.  Exam reveals no gallop and no friction rub.   No murmur heard. Pulmonary/Chest: Effort normal and breath sounds normal. She has no wheezes. She has no rales.  Neurological: She is  alert and oriented to person, place, and time.  Skin: She is not diaphoretic.  Psychiatric: She has a normal mood and affect. Her behavior is normal.  Nursing note and vitals reviewed.       Assessment & Plan:   1. Tinnitus, left   2. Allergic rhinitis due to pollen   3. Dizziness   4. Acute recurrent maxillary sinusitis    -New. -Rx for Amoxicillin provided. -restart Zyrtec daily; continue Flonase daily. -refer to ENT due to persistent  tinnitus and dizziness.   Orders Placed This Encounter  Procedures  . Ambulatory referral to ENT    Referral Priority:  Routine    Referral Type:  Consultation    Referral Reason:  Specialty Services Required    Requested Specialty:  Otolaryngology    Number of Visits Requested:  1   Meds ordered this encounter  Medications  . amoxicillin (AMOXIL) 500 MG tablet    Sig: Take 2 tablets (1,000 mg total) by mouth 2 (two) times daily.    Dispense:  40 tablet    Refill:  0    No Follow-up on file.    Kamee Bobst Elayne Guerin, M.D. [facility] [address] [place], [place]  [address] [phone] phone [phone] fax

## 2015-04-03 NOTE — Patient Instructions (Signed)
1.  START ALLERGY MEDICATION ONE TABLET DAILY.   Tinnitus Tinnitus refers to hearing a sound when there is no actual source for that sound. This is often described as ringing in the ears. However, people with this condition may hear a variety of noises. A person may hear the sound in one ear or in both ears.  The sounds of tinnitus can be soft, loud, or somewhere in between. Tinnitus can last for a few seconds or can be constant for days. It may go away without treatment and come back at various times. When tinnitus is constant or happens often, it can lead to other problems, such as trouble sleeping and trouble concentrating. Almost everyone experiences tinnitus at some point. Tinnitus that is long-lasting (chronic) or comes back often is a problem that may require medical attention.  CAUSES  The cause of tinnitus is often not known. In some cases, it can result from other problems or conditions, including:   Exposure to loud noises from machinery, music, or other sources.  Hearing loss.  Ear or sinus infections.  Earwax buildup.  A foreign object in the ear.  Use of certain medicines.  Use of alcohol and caffeine.  High blood pressure.  Heart diseases.  Anemia.  Allergies.  Meniere disease.  Thyroid problems.  Tumors.  An enlarged part of a weakened blood vessel (aneurysm). SYMPTOMS The main symptom of tinnitus is hearing a sound when there is no source for that sound. It may sound like:   Buzzing.  Roaring.  Ringing.  Blowing air, similar to the sound heard when you listen to a seashell.  Hissing.  Whistling.  Sizzling.  Humming.  Running water.  A sustained musical note. DIAGNOSIS  Tinnitus is diagnosed based on your symptoms. Your health care provider will do a physical exam. A comprehensive hearing exam (audiologic exam) will be done if your tinnitus:   Affects only one ear (unilateral).  Causes hearing difficulties.  Lasts 6 months or  longer. You may also need to see a health care provider who specializes in hearing disorders (audiologist). You may be asked to complete a questionnaire to determine the severity of your tinnitus. Tests may be done to help determine the cause and to rule out other conditions. These can include:  Imaging studies of your head and brain, such as:  A CT scan.  An MRI.  An imaging study of your blood vessels (angiogram). TREATMENT  Treating an underlying medical condition can sometimes make tinnitus go away. If your tinnitus continues, other treatments may include:  Medicines, such as certain antidepressants or sleeping aids.  Sound generators to mask the tinnitus. These include:  Tabletop sound machines that play relaxing sounds to help you fall asleep.  Wearable devices that fit in your ear and play sounds or music.  A small device that uses headphones to deliver a signal embedded in music (acoustic neural stimulation). In time, this may change the pathways of your brain and make you less sensitive to tinnitus. This device is used for very severe cases when no other treatment is working.  Therapy and counseling to help you manage the stress of living with tinnitus.  Using hearing aids or cochlear implants, if your tinnitus is related to hearing loss. HOME CARE INSTRUCTIONS  When possible, avoid being in loud places and being exposed to loud sounds.  Wear hearing protection, such as earplugs, when you are exposed to loud noises.  Do not take stimulants, such as nicotine, alcohol, or caffeine.  Practice techniques for reducing stress, such as meditation, yoga, or deep breathing.  Use a white noise machine, a humidifier, or other devices to mask the sound of tinnitus.  Sleep with your head slightly raised. This may reduce the impact of tinnitus.  Try to get plenty of rest each night. SEEK MEDICAL CARE IF:  You have tinnitus in just one ear.  Your tinnitus continues for 3 weeks  or longer without stopping.  Home care measures are not helping.  You have tinnitus after a head injury.  You have tinnitus along with any of the following:  Dizziness.  Loss of balance.  Nausea and vomiting.   This information is not intended to replace advice given to you by your health care provider. Make sure you discuss any questions you have with your health care provider.   Document Released: 04/02/2005 Document Revised: 04/23/2014 Document Reviewed: 09/02/2013 Elsevier Interactive Patient Education [employer].

## 2015-09-06 ENCOUNTER — Ambulatory Visit
Admission: RE | Admit: 2015-09-06 | Discharge: 2015-09-06 | Disposition: A | Discharge status: Home / Self Care | Payer: BC Managed Care – PPO | Source: Ambulatory Visit | Attending: Internal Medicine | Admitting: Internal Medicine

## 2015-09-06 DIAGNOSIS — Z1231 Encounter for screening mammogram for malignant neoplasm of breast: Secondary | ICD-10-CM | POA: Insufficient documentation

## 2015-09-06 DIAGNOSIS — Z23 Encounter for immunization: Secondary | ICD-10-CM

## 2015-09-06 DIAGNOSIS — C50911 Malignant neoplasm of unspecified site of right female breast: Secondary | ICD-10-CM

## 2015-09-06 HISTORY — DX: Malignant neoplasm of unspecified site of unspecified female breast: C50.919

## 2015-09-16 ENCOUNTER — Ambulatory Visit: Payer: BC Managed Care – PPO | Admitting: Family Medicine

## 2015-09-27 ENCOUNTER — Ambulatory Visit: Payer: BC Managed Care – PPO | Admitting: Family Medicine

## 2015-10-21 ENCOUNTER — Ambulatory Visit: Payer: Self-pay | Admitting: Internal Medicine

## 2015-12-20 ENCOUNTER — Encounter: Payer: Self-pay | Admitting: Family Medicine

## 2015-12-20 ENCOUNTER — Ambulatory Visit (INDEPENDENT_AMBULATORY_CARE_PROVIDER_SITE_OTHER): Payer: BC Managed Care – PPO | Admitting: Family Medicine

## 2015-12-20 VITALS — BP 120/88 | HR 89 | Temp 98.4°F | Resp 16 | Ht 71.0 in | Wt 176.2 lb

## 2015-12-20 DIAGNOSIS — R7302 Impaired glucose tolerance (oral): Secondary | ICD-10-CM

## 2015-12-20 DIAGNOSIS — Z23 Encounter for immunization: Secondary | ICD-10-CM | POA: Diagnosis not present

## 2015-12-20 DIAGNOSIS — J301 Allergic rhinitis due to pollen: Secondary | ICD-10-CM

## 2015-12-20 DIAGNOSIS — L259 Unspecified contact dermatitis, unspecified cause: Secondary | ICD-10-CM

## 2015-12-20 DIAGNOSIS — E7800 Pure hypercholesterolemia, unspecified: Secondary | ICD-10-CM

## 2015-12-20 DIAGNOSIS — K5901 Slow transit constipation: Secondary | ICD-10-CM

## 2015-12-20 DIAGNOSIS — I10 Essential (primary) hypertension: Secondary | ICD-10-CM

## 2015-12-20 LAB — COMPREHENSIVE METABOLIC PANEL
ALBUMIN: 4.8 g/dL (ref 3.6–5.1)
ALK PHOS: 59 U/L (ref 33–130)
ALT: 13 U/L (ref 6–29)
AST: 17 U/L (ref 10–35)
BUN: 8 mg/dL (ref 7–25)
CALCIUM: 9.5 mg/dL (ref 8.6–10.4)
CHLORIDE: 96 mmol/L — AB (ref 98–110)
CO2: 30 mmol/L (ref 20–31)
Creat: 0.77 mg/dL (ref 0.50–0.99)
Glucose, Bld: 89 mg/dL (ref 65–99)
POTASSIUM: 3.9 mmol/L (ref 3.5–5.3)
Sodium: 136 mmol/L (ref 135–146)
Total Bilirubin: 0.6 mg/dL (ref 0.2–1.2)
Total Protein: 7.6 g/dL (ref 6.1–8.1)

## 2015-12-20 LAB — LIPID PANEL
CHOL/HDL RATIO: 2.3 ratio (ref <=5.0)
CHOLESTEROL: 178 mg/dL (ref 125–200)
HDL: 77 mg/dL (ref >=46)
LDL Cholesterol: 83 mg/dL (ref <130)
TRIGLYCERIDES: 89 mg/dL (ref <150)
VLDL: 18 mg/dL (ref <30)

## 2015-12-20 LAB — CBC WITH DIFFERENTIAL/PLATELET
BASOS ABS: 84 {cells}/uL (ref 0–200)
Basophils Relative: 2 %
Eosinophils Absolute: 252 cells/uL (ref 15–500)
Eosinophils Relative: 6 %
HEMATOCRIT: 38.3 % (ref 35.0–45.0)
HEMOGLOBIN: 12.7 g/dL (ref 11.7–15.5)
LYMPHS PCT: 34 %
Lymphs Abs: 1428 cells/uL (ref 850–3900)
MCH: 30.4 pg (ref 27.0–33.0)
MCHC: 33.2 g/dL (ref 32.0–36.0)
MCV: 91.6 fL (ref 80.0–100.0)
MONO ABS: 378 {cells}/uL (ref 200–950)
MPV: 9.9 fL (ref 7.5–12.5)
Monocytes Relative: 9 %
NEUTROS PCT: 49 %
Neutro Abs: 2058 cells/uL (ref 1500–7800)
Platelets: 421 10*3/uL — ABNORMAL HIGH (ref 140–400)
RBC: 4.18 MIL/uL (ref 3.80–5.10)
RDW: 13.5 % (ref 11.0–15.0)
WBC: 4.2 10*3/uL (ref 3.8–10.8)

## 2015-12-20 MED ORDER — TRIAMCINOLONE ACETONIDE 0.1 % EX CREA: 1.0000 "application " | TOPICAL_CREAM | Freq: Two times a day (BID) | CUTANEOUS | 0 refills | Status: DC

## 2015-12-20 NOTE — Patient Instructions (Signed)
   IF you received an x-ray today, you will receive an invoice from [facility]. Please contact [facility] at [phone] with questions or concerns regarding your invoice.   IF you received labwork today, you will receive an invoice from [facility]. Please contact [facility] at [phone] with questions or concerns regarding your invoice.   Our billing staff will not be able to assist you with questions regarding bills from these companies.  You will be contacted with the lab results as soon as they are available. The fastest way to get your results is to activate your My Chart account. Instructions are located on the last page of this paperwork. If you have not heard from us regarding the results in 2 weeks, please contact this office.    Influenza (Flu) Vaccine (Inactivated or Recombinant):  1. Why get vaccinated? Influenza ("flu") is a contagious disease that spreads around the [place] every year, usually between October and May. Flu is caused by influenza viruses, and is spread mainly by coughing, sneezing, and close contact. Anyone can get flu. Flu strikes suddenly and can last several days. Symptoms vary by age, but can include:  fever/chills  sore throat  muscle aches  fatigue  cough  headache  runny or stuffy nose Flu can also lead to pneumonia and blood infections, and cause diarrhea and seizures in children. If you have a medical condition, such as heart or lung disease, flu can make it worse. Flu is more dangerous for some people. Infants and young children, people 65 years of age and older, pregnant women, and people with certain health conditions or a weakened immune system are at greatest risk. Each year thousands of people in the [place] die from flu, and many more are hospitalized. Flu vaccine can:  keep you from getting flu,  make flu less severe if you do get it, and  keep you from spreading flu to  your family and other people. 2. Inactivated and recombinant flu vaccines A dose of flu vaccine is recommended every flu season. Children 6 months through 8 years of age may need two doses during the same flu season. Everyone else needs only one dose each flu season. Some inactivated flu vaccines contain a very small amount of a mercury-based preservative called thimerosal. Studies have not shown thimerosal in vaccines to be harmful, but flu vaccines that do not contain thimerosal are available. There is no live flu virus in flu shots. They cannot cause the flu. There are many flu viruses, and they are always changing. Each year a new flu vaccine is made to protect against three or four viruses that are likely to cause disease in the upcoming flu season. But even when the vaccine doesn't exactly match these viruses, it may still provide some protection. Flu vaccine cannot prevent:  flu that is caused by a virus not covered by the vaccine, or  illnesses that look like flu but are not. It takes about 2 weeks for protection to develop after vaccination, and protection lasts through the flu season. 3. Some people should not get this vaccine Tell the person who is giving you the vaccine:  If you have any severe, life-threatening allergies. If you ever had a life-threatening allergic reaction after a dose of flu vaccine, or have a severe allergy to any part of this vaccine, you may be advised not to get vaccinated. Most, but not all, types of flu vaccine contain a small amount of egg protein.    If you ever had Guillain-Barre Syndrome (also called GBS). Some people with a history of GBS should not get this vaccine. This should be discussed with your doctor.  If you are not feeling well. It is usually okay to get flu vaccine when you have a mild illness, but you might be asked to come back when you feel better. 4. Risks of a vaccine reaction With any medicine, including vaccines, there is a chance of  reactions. These are usually mild and go away on their own, but serious reactions are also possible. Most people who get a flu shot do not have any problems with it. Minor problems following a flu shot include:  soreness, redness, or swelling where the shot was given  hoarseness  sore, red or itchy eyes  cough  fever  aches  headache  itching  fatigue If these problems occur, they usually begin soon after the shot and last 1 or 2 days. More serious problems following a flu shot can include the following:  There may be a small increased risk of Guillain-Barre Syndrome (GBS) after inactivated flu vaccine. This risk has been estimated at 1 or 2 additional cases per million people vaccinated. This is much lower than the risk of severe complications from flu, which can be prevented by flu vaccine.  Young children who get the flu shot along with pneumococcal vaccine (PCV13) and/or DTaP vaccine at the same time might be slightly more likely to have a seizure caused by fever. Ask your doctor for more information. Tell your doctor if a child who is getting flu vaccine has ever had a seizure. Problems that could happen after any injected vaccine:  People sometimes faint after a medical procedure, including vaccination. Sitting or lying down for about 15 minutes can help prevent fainting, and injuries caused by a fall. Tell your doctor if you feel dizzy, or have vision changes or ringing in the ears.  Some people get severe pain in the shoulder and have difficulty moving the arm where a shot was given. This happens very rarely.  Any medication can cause a severe allergic reaction. Such reactions from a vaccine are very rare, estimated at about 1 in a million doses, and would happen within a few minutes to a few hours after the vaccination. As with any medicine, there is a very remote chance of a vaccine causing a serious injury or death. The safety of vaccines is always being monitored. For  more information, visit: www.cdc.gov/vaccinesafety/ 5. What if there is a serious reaction? What should I look for?  Look for anything that concerns you, such as signs of a severe allergic reaction, very high fever, or unusual behavior. Signs of a severe allergic reaction can include hives, swelling of the face and throat, difficulty breathing, a fast heartbeat, dizziness, and weakness. These would start a few minutes to a few hours after the vaccination. What should I do?  If you think it is a severe allergic reaction or other emergency that can't wait, call 9-1-1 and get the person to the nearest hospital. Otherwise, call your doctor.  Reactions should be reported to the Vaccine Adverse Event Reporting System (VAERS). Your doctor should file this report, or you can do it yourself through the VAERS web site at www.vaers.hhs.gov, or by calling 1-800-822-7967. VAERS does not give medical advice. 6. The National Vaccine Injury Compensation Program The National Vaccine Injury Compensation Program (VICP) is a federal program that was created to compensate people who may have been   injured by certain vaccines. Persons who believe they may have been injured by a vaccine can learn about the program and about filing a claim by calling 1-800-338-2382 or visiting the VICP website at www.hrsa.gov/vaccinecompensation. There is a time limit to file a claim for compensation. 7. How can I learn more?  Ask your healthcare provider. He or she can give you the vaccine package insert or suggest other sources of information.  Call your local or state health department.  Contact the Centers for Disease Control and Prevention (CDC):  Call 1-800-232-4636 (1-800-CDC-INFO) or  Visit CDC's website at www.cdc.gov/flu Vaccine Information Statement Inactivated Influenza Vaccine (11/20/2013)   This information is not intended to replace advice given to you by your health care provider. Make sure you discuss any  questions you have with your health care provider.   Document Released: 01/25/2006 Document Revised: 04/23/2014 Document Reviewed: 11/23/2013 Elsevier Interactive Patient Education 2016 Elsevier Inc.  

## 2015-12-20 NOTE — Progress Notes (Signed)
Subjective:    Patient ID: [name], female    DOB: [birth year], 65 y.o.   MRN: [id number]  12/20/2015  Other (f/u HTN, Cholesterol, flu shot)  By signing my name below, I, [name], attest that this documentation has been prepared under the direction and in the presence of Sonia Baller, MD. Electronically Signed: Judithe Cook, ER Scribe. 12/20/2015. 12:21 PM.  HPI HPI Comments: [name] is a 65 y.o. female who presents to [facility] reporting for a six month follow up appointment for hypertension, hyperlipidemia, glucose intolerance, and allergic rhinitis. She is not checking her BP at home. She is feeling well. She is very busy at work. She is sleeping well. She is working 25 hours per week. She has been compliant with her medications. She is tired of drinking miralax. She is curious if she can change her diet so she will not have to take miralax. She is taking flonase with good control.   She is having a mild intermittent rash on her bilateral hands. She believes she is reacting to something she is touching at work. She denies CP, palpitations, leg swelling. She has a regular daily BM. She takes occasional GERD medication due to GERD sx.    Review of Systems  Constitutional: Negative for chills, diaphoresis, fatigue and fever.  Eyes: Negative for visual disturbance.  Respiratory: Negative for cough and shortness of breath.   Cardiovascular: Negative for chest pain, palpitations and leg swelling.  Gastrointestinal: Positive for constipation. Negative for abdominal pain, diarrhea, nausea and vomiting.  Endocrine: Negative for cold intolerance, heat intolerance, polydipsia, polyphagia and polyuria.  Skin: Positive for color change and rash.  Neurological: Negative for dizziness, tremors, seizures, syncope, facial asymmetry, speech difficulty, weakness, light-headedness, numbness and headaches.    Past Medical History:  Diagnosis Date  . Allergy    seasonal  .  Breast cancer ([place])    right mastectomy-2008  . Cancer ([place])    Breast Stage IIx2000  . Hyperlipidemia   . Hypertension   . Migraines    Past Surgical History:  Procedure Laterality Date  . ABDOMINAL HYSTERECTOMY  03/16/2012   uterine prolapse.  Ovaries resected.   [facility].  . COLONOSCOPY  04/17/2007   normal; repeat in 10 years.  [facility].  . COLPOPEXY  05/17/13   [facility]  . MASTECTOMY MODIFIED RADICAL  2008   had chemo   Allergies  Allergen Reactions  . Codeine Other (See Comments)    hallunication  . Eucalyptus Flavor [Flavoring Agent] Other (See Comments)    Causes difficulty in breathing   Current Outpatient Prescriptions  Medication Sig Dispense Refill  . aspirin 81 MG tablet Take 81 mg by mouth daily.    . Cholecalciferol (VITAMIN D-3 PO) Take 1,000 Units by mouth daily.    . fluticasone (FLONASE) 50 MCG/ACT nasal spray 2 sprays into each nostril daily. 16 g 11  . hydrochlorothiazide (HYDRODIURIL) 25 MG tablet Take 1 tablet (25 mg total) by mouth daily. 90 tablet 3  . lisinopril (PRINIVIL,ZESTRIL) 10 MG tablet Take 1 tablet (10 mg total) by mouth daily. 90 tablet 3  . polyethylene glycol powder (MIRALAX) powder Take 17 g by mouth.     . simvastatin (ZOCOR) 10 MG tablet Take 1 tablet (10 mg total) by mouth every evening. 90 tablet 3  . SUMAtriptan (IMITREX) 100 MG tablet Take 0.5 tablets (50 mg total) by mouth every 2 (two) hours as needed for migraine. May repeat in 2 hours  once. 8 tablet 5  . meclizine (ANTIVERT) 12.5 MG tablet Take 1 tablet (12.5 mg total) by mouth 3 (three) times daily as needed for dizziness or nausea. (Patient not taking: Reported on 12/20/2015) 30 tablet 0  . triamcinolone cream (KENALOG) 0.1 % Apply 1 application topically 2 (two) times daily. 30 g 0   No current facility-administered medications for this visit.    Social History   Social History  . Marital status: Married    Spouse name: N/A  . Number of children: 1  . Years of  education: N/A   Occupational History  . 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 Smoking status: Never Smoker  . Smokeless tobacco: Never Used  . Alcohol use No  . Drug use: No  . Sexual activity: Yes    Birth control/ protection: Post-menopausal, Surgical   Other Topics Concern  . Not on file   Social History Narrative   Marital status:  Married x 35 years; happily married; no abuse.      Children: one daughter (47) [place]; no grandchildren.      Lives: with husband in [place].      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  Working at [employer] 20 hours per week.      Tobacco:  Never.      Alcohol: none      Drugs: none      Exercise:  Job very physical; walking during the week.  Walking on walking track 3 days per week.      Seatbelt:  100%      Guns: loaded; unsecured.      Sunscreen:  None; rare sun exposure.   Family History  Problem Relation Age of Onset  . Stroke Mother     TIAs in 66s.  . Hypertension Mother   . Heart disease Father 29    AMI age 21  . Hypertension Father   . Drug abuse Sister   . Cancer Sister     CLL  . Cervical cancer Sister   . Cancer Sister     Cervical cancer       Objective:    BP 120/88 (BP Location: Left Arm, Patient Position: Sitting, Cuff Size: Normal)   Pulse 89   Temp 98.4 F (36.9 C) (Oral)   Resp 16   Ht _0  (1.803 m)   Wt 176 lb 3.2 oz (79.9 kg)   SpO2 98%   BMI 24.57 kg/m  Physical Exam  Constitutional: She is oriented to person, place, and time. She appears well-developed and well-nourished. No distress.  HENT:  Head: Normocephalic and atraumatic.  Right Ear: External ear normal.  Left Ear: External ear normal.  Nose: Nose normal.  Mouth/Throat: Oropharynx is clear and moist.  Eyes: Conjunctivae and EOM are normal. Pupils are equal, round, and reactive to light.  Neck: Normal range  of motion. Neck supple. Carotid bruit is not present. No thyromegaly present.  Cardiovascular: Normal rate, regular rhythm, normal heart sounds and intact distal pulses.  Exam reveals no gallop and no friction rub.   No murmur heard. Pulmonary/Chest: Effort normal and breath sounds normal. No respiratory distress. She has no wheezes. She has no rales.  Abdominal: Soft. Bowel sounds are normal. She exhibits no distension and no mass. There is no tenderness. There is no rebound and no guarding.  Musculoskeletal: Normal range of motion.  Lymphadenopathy:    She has no cervical adenopathy.  Neurological: She is alert and oriented to person, place, and time. No cranial nerve deficit. Coordination normal.  Skin: Skin is warm and dry. No rash noted. She is not diaphoretic. No erythema. No pallor.  Psychiatric: She has a normal mood and affect. Her behavior is normal.  Nursing note and vitals reviewed.  Results for orders placed or performed in visit on 12/20/15  CBC with Differential/Platelet  Result Value Ref Range   WBC 4.2 3.8 - 10.8 K/uL   RBC 4.18 3.80 - 5.10 MIL/uL   Hemoglobin 12.7 11.7 - 15.5 g/dL   HCT 38.3 35.0 - 45.0 %   MCV 91.6 80.0 - 100.0 fL   MCH 30.4 27.0 - 33.0 pg   MCHC 33.2 32.0 - 36.0 g/dL   RDW 13.5 11.0 - 15.0 %   Platelets 421 (H) 140 - 400 K/uL   MPV 9.9 7.5 - 12.5 fL   Neutro Abs 2,058 1,500 - 7,800 cells/uL   Lymphs Abs 1,428 850 - 3,900 cells/uL   Monocytes Absolute 378 200 - 950 cells/uL   Eosinophils Absolute 252 15 - 500 cells/uL   Basophils Absolute 84 0 - 200 cells/uL   Neutrophils Relative % 49 %   Lymphocytes Relative 34 %   Monocytes Relative 9 %   Eosinophils Relative 6 %   Basophils Relative 2 %   Smear Review Criteria for review not met   Comprehensive metabolic panel  Result Value Ref Range   Sodium 136 135 - 146 mmol/L   Potassium 3.9 3.5 - 5.3 mmol/L   Chloride 96 (L) 98 - 110 mmol/L   CO2 30 20 - 31 mmol/L   Glucose, Bld 89 65 - 99 mg/dL     BUN 8 7 - 25 mg/dL   Creat 0.77 0.50 - 0.99 mg/dL   Total Bilirubin 0.6 0.2 - 1.2 mg/dL   Alkaline Phosphatase 59 33 - 130 U/L   AST 17 10 - 35 U/L   ALT 13 6 - 29 U/L   Total Protein 7.6 6.1 - 8.1 g/dL   Albumin 4.8 3.6 - 5.1 g/dL   Calcium 9.5 8.6 - 10.4 mg/dL  Lipid panel  Result Value Ref Range   Cholesterol 178 125 - 200 mg/dL   Triglycerides 89 <150 mg/dL   HDL 77 >=46 mg/dL   Total CHOL/HDL Ratio 2.3 <=5.0 Ratio   VLDL 18 <30 mg/dL   LDL Cholesterol 83 <130 mg/dL  Hemoglobin A1c  Result Value Ref Range   Hgb A1c MFr Bld 5.7 (H) <5.7 %   Mean Plasma Glucose 117 mg/dL       Assessment & Plan:   1. Essential hypertension, benign   2. Glucose intolerance (impaired glucose tolerance)   3. Pure hypercholesterolemia   4. Allergic rhinitis due to pollen   5. Slow transit constipation   6. Contact dermatitis   7. Flu vaccine need    -stable; obtain labs; continue current medications. -rx for steroid cream for contact dermatitis recurrent.   Orders Placed This Encounter  Procedures  . Flu Vaccine QUAD 36+ mos IM  . CBC with Differential/Platelet  . Comprehensive metabolic panel    Order Specific Question:   Has the patient fasted?    Answer:   Yes  . Lipid panel    Order Specific Question:   Has the patient fasted?    Answer:   Yes  . Hemoglobin  A1c   Meds ordered this encounter  Medications  . triamcinolone cream (KENALOG) 0.1 %    Sig: Apply 1 application topically 2 (two) times daily.    Dispense:  30 g    Refill:  0    Return in about 6 months (around 06/18/2016) for complete physical examiniation.    I personally performed the services described in this documentation, which was scribed in my presence. The recorded information has been reviewed and considered.  Ewelina Naves Elayne Guerin, M.D. [facility] [address] [place], [place]  [phone] phone [phone] fax

## 2015-12-21 LAB — HEMOGLOBIN A1C
Hgb A1c MFr Bld: 5.7 % — ABNORMAL HIGH (ref <5.7)
MEAN PLASMA GLUCOSE: 117 mg/dL

## 2015-12-31 ENCOUNTER — Other Ambulatory Visit: Payer: Self-pay | Admitting: Family Medicine

## 2016-01-11 NOTE — Progress Notes (Signed)
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  INTERVAL HISTORY: Patient returns to clinic today for routine yearly follow-up. She currently feels well and is asymptomatic. She has no neurologic complaints. She denies any recent fevers or illnesses. She has good appetite and denies weight loss. She denies any bony pain. She has no chest pain or shortness of breath. She denies any nausea, vomiting, constipation, or diarrhea. She has no urinary complaints. Patient feels at her baseline and offers no specific complaints today.  REVIEW OF SYSTEMS:   Review of Systems  Constitutional: Negative.  Negative for fever, malaise/fatigue and weight loss.  Respiratory: Negative.  Negative for cough and shortness of breath.   Cardiovascular: Negative.  Negative for chest pain and leg swelling.  Gastrointestinal: Negative.  Negative for abdominal pain.  Genitourinary: Negative.   Musculoskeletal: Negative.   Neurological: Negative.  Negative for sensory change and weakness.  Psychiatric/Behavioral: Negative.  The patient is not nervous/anxious.     As per HPI. Otherwise, a complete review of systems is negative.  PAST MEDICAL HISTORY: Past Medical History:  Diagnosis Date  . Allergy    seasonal  . Breast cancer ([place])    right mastectomy-2008  . Cancer ([place])    Breast Stage IIx2000  . Hyperlipidemia   . Hypertension   . Migraines     PAST SURGICAL HISTORY: Past Surgical History:  Procedure Laterality Date  . ABDOMINAL HYSTERECTOMY  03/16/2012   uterine prolapse.  Ovaries resected.   [facility].  . COLONOSCOPY  04/17/2007   normal; repeat in 10 years.  [facility]  . [facility]POPEXY  05/17/13   [facility]  .  MASTECTOMY MODIFIED RADICAL  2008   had chemo    FAMILY HISTORY: Family History  Problem Relation Age of Onset  . Stroke Mother     TIAs in 66s.  . Hypertension Mother   . Heart disease Father 25    AMI age 4  . Hypertension Father   . Drug abuse Sister   . Cancer Sister     CLL  . Cervical cancer Sister   . Cancer Sister     Cervical cancer    ADVANCED DIRECTIVES (Y/N):  N  HEALTH MAINTENANCE: Social History  Substance Use Topics  . Smoking status: Never Smoker  . Smokeless tobacco: Never Used  . Alcohol use No     Colonoscopy:  PAP:  Bone density:  Lipid panel:  Allergies  Allergen Reactions  . Eucalyptus Oil Anaphylaxis  . Other Shortness Of Breath    Euculyptus - causes shortness of breath  . Codeine Other (See Comments)    hallunication  . Eucalyptus Flavor [Flavoring Agent] Other (See Comments)    Causes difficulty in breathing    Current Outpatient Prescriptions  Medication Sig Dispense Refill  . amoxicillin (AMOXIL) 500 MG capsule Take 2 capsules (1,000 mg total) by mouth 2 (two) times daily. 40 capsule 0  . aspirin 81 MG tablet Take 81 mg by mouth daily.    . cetirizine (ZYRTEC) 10 MG tablet Take 1 tablet (10 mg total) by mouth daily. 90 tablet 3  . Cholecalciferol (VITAMIN D-3 PO) Take 1,000 Units by mouth daily.    . fluticasone (FLONASE) 50 MCG/ACT nasal spray 2 sprays into each  nostril daily. 16 g 11  . hydrochlorothiazide (HYDRODIURIL) 25 MG tablet Take 1 tablet (25 mg total) by mouth daily. 90 tablet 3  . lisinopril (PRINIVIL,ZESTRIL) 10 MG tablet Take 1 tablet (10 mg total) by mouth daily. 90 tablet 3  . polyethylene glycol powder (MIRALAX) powder Take 17 g by mouth.     . simvastatin (ZOCOR) 10 MG tablet Take 1 tablet (10 mg total) by mouth every evening. 90 tablet 3  . triamcinolone cream (KENALOG) 0.1 % Apply 1 application topically 2 (two) times daily. 30 g 0  . trimethoprim-polymyxin b (POLYTRIM) ophthalmic solution Place 2 drops into  the right eye every 6 (six) hours. 10 mL 0  . meclizine (ANTIVERT) 12.5 MG tablet Take 1 tablet (12.5 mg total) by mouth 3 (three) times daily as needed for dizziness or nausea. (Patient not taking: Reported on 01/13/2016) 30 tablet 0  . SUMAtriptan (IMITREX) 100 MG tablet Take 0.5 tablets (50 mg total) by mouth every 2 (two) hours as needed for migraine. May repeat in 2 hours once. (Patient not taking: Reported on 01/13/2016) 8 tablet 5   No current facility-administered medications for this visit.     OBJECTIVE: Vitals:   01/13/16 0904  BP: 124/84  Pulse: 73  Temp: 97.7 F (36.5 C)     Body mass index is 24.51 kg/m.    ECOG FS:0 - Asymptomatic  General: Well-developed, well-nourished, no acute distress. Eyes: Pink conjunctiva, anicteric sclera. HEENT: Normocephalic, moist mucous membranes, clear oropharnyx. Breasts: Right mastectomy. Lungs: Clear to auscultation bilaterally. Heart: Regular rate and rhythm. No rubs, murmurs, or gallops. Abdomen: Soft, nontender, nondistended. No organomegaly noted, normoactive bowel sounds. Musculoskeletal: No edema, cyanosis, or clubbing. Neuro: Alert, answering all questions appropriately. Cranial nerves grossly intact. Skin: No rashes or petechiae noted. Psych: Normal affect.   LAB RESULTS:  Lab Results  Component Value Date   NA 136 12/20/2015   K 3.9 12/20/2015   CL 96 (L) 12/20/2015   CO2 30 12/20/2015   GLUCOSE 89 12/20/2015   BUN 8 12/20/2015   CREATININE 0.77 12/20/2015   CALCIUM 9.5 12/20/2015   PROT 7.6 12/20/2015   ALBUMIN 4.8 12/20/2015   AST 17 12/20/2015   ALT 13 12/20/2015   ALKPHOS 59 12/20/2015   BILITOT 0.6 12/20/2015   GFRNONAA >60 01/20/2015   GFRAA >60 01/20/2015    Lab Results  Component Value Date   WBC 4.2 12/20/2015   NEUTROABS 2,058 12/20/2015   HGB 12.7 12/20/2015   HCT 38.3 12/20/2015   MCV 91.6 12/20/2015   PLT 421 (H) 12/20/2015   Lab Results  Component Value Date   LABCA2 25.0 01/20/2015      STUDIES: No results found.  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 She subsequently completed adjuvant Adriamycin and Cytoxan as well as 12 weeks of Taxol in May 2009. She completed her yearly Herceptin maintenance in February 2010. She completed nearly 7 years of letrozole in approximately May 2016. Her most recent mammogram on Sep 06, 2015 was reported as BI-RADS 1. Repeat in May 2018. Return to clinic in 1 year for routine evaluation. 2. Thrombocytosis: Likely reactive, monitor.  Patient expressed understanding and was in agreement with this plan. She also understands that She can  call clinic at any time with any questions, concerns, or complaints.   Lloyd Huger, MD   01/13/2016 9:27 AM

## 2016-01-12 ENCOUNTER — Ambulatory Visit (INDEPENDENT_AMBULATORY_CARE_PROVIDER_SITE_OTHER): Payer: BC Managed Care – PPO | Admitting: Family Medicine

## 2016-01-12 VITALS — BP 140/86 | HR 71 | Temp 98.1°F | Resp 17 | Ht 71.0 in | Wt 176.0 lb

## 2016-01-12 DIAGNOSIS — H1031 Unspecified acute conjunctivitis, right eye: Secondary | ICD-10-CM

## 2016-01-12 DIAGNOSIS — J0101 Acute recurrent maxillary sinusitis: Secondary | ICD-10-CM | POA: Diagnosis not present

## 2016-01-12 DIAGNOSIS — J301 Allergic rhinitis due to pollen: Secondary | ICD-10-CM

## 2016-01-12 MED ORDER — CETIRIZINE HCL 10 MG PO TABS
10.0000 mg | ORAL_TABLET | Freq: Every day | ORAL | 3 refills | Status: DC
Start: 2016-01-12 — End: 2016-06-19

## 2016-01-12 MED ORDER — POLYMYXIN B-TRIMETHOPRIM 10000-0.1 UNIT/ML-% OP SOLN: 2.0000 [drp] | Freq: Four times a day (QID) | OPHTHALMIC | 0 refills | Status: DC

## 2016-01-12 MED ORDER — AMOXICILLIN 500 MG PO CAPS: 1000.0000 mg | ORAL_CAPSULE | Freq: Two times a day (BID) | ORAL | 0 refills | Status: DC

## 2016-01-12 NOTE — Patient Instructions (Signed)
IF you received an x-ray today, you will receive an invoice from [facility]. Please contact [facility] at [phone] with questions or concerns regarding your invoice.   IF you received labwork today, you will receive an invoice from [employer]. Please contact [facility] at [phone] with questions or concerns regarding your invoice.   Our billing staff will not be able to assist you with questions regarding bills from these companies.  You will be contacted with the lab results as soon as they are available. The fastest way to get your results is to activate your My Chart account. Instructions are located on the last page of this paperwork. If you have not heard from [place] regarding the results in 2 weeks, please contact this office.       Bacterial Conjunctivitis Bacterial conjunctivitis, commonly called pink eye, is an inflammation of the clear membrane that covers the white part of the eye (conjunctiva). The inflammation can also happen on the underside of the eyelids. The blood vessels in the conjunctiva become inflamed, causing the eye to become red or pink. Bacterial conjunctivitis may spread easily from one eye to another and from person to person (contagious).  CAUSES  Bacterial conjunctivitis is caused by bacteria. The bacteria may come from your own skin, your upper respiratory tract, or from someone else with bacterial conjunctivitis. SYMPTOMS  The normally white color of the eye or the underside of the eyelid is usually pink or red. The pink eye is usually associated with irritation, tearing, and some sensitivity to light. Bacterial conjunctivitis is often associated with a thick, yellowish discharge from the eye. The discharge may turn into a crust on the eyelids overnight, which causes your eyelids to stick together. If a discharge is present, there may also be some blurred vision in the affected eye. DIAGNOSIS  Bacterial  conjunctivitis is diagnosed by your caregiver through an eye exam and the symptoms that you report. Your caregiver looks for changes in the surface tissues of your eyes, which may point to the specific type of conjunctivitis. A sample of any discharge may be collected on a cotton-tip swab if you have a severe case of conjunctivitis, if your cornea is affected, or if you keep getting repeat infections that do not respond to treatment. The sample will be sent to a lab to see if the inflammation is caused by a bacterial infection and to see if the infection will respond to antibiotic medicines. TREATMENT   Bacterial conjunctivitis is treated with antibiotics. Antibiotic eyedrops are most often used. However, antibiotic ointments are also available. Antibiotics pills are sometimes used. Artificial tears or eye washes may ease discomfort. HOME CARE INSTRUCTIONS   To ease discomfort, apply a cool, clean washcloth to your eye for 10-20 minutes, 3-4 times a day.  Gently wipe away any drainage from your eye with a warm, wet washcloth or a cotton ball.  Wash your hands often with soap and water. Use paper towels to dry your hands.  Do not share towels or washcloths. This may spread the infection.  Change or wash your pillowcase every day.  You should not use eye makeup until the infection is gone.  Do not operate machinery or drive if your vision is blurred.  Stop using contact lenses. Ask your caregiver how to sterilize or replace your contacts before using them again. This depends on the type of contact lenses that you use.  When applying medicine to the infected eye, do not  touch the edge of your eyelid with the eyedrop bottle or ointment tube. SEEK IMMEDIATE MEDICAL CARE IF:   Your infection has not improved within 3 days after beginning treatment.  You had yellow discharge from your eye and it returns.  You have increased eye pain.  Your eye redness is spreading.  Your vision becomes  blurred.  You have a fever or persistent symptoms for more than 2-3 days.  You have a fever and your symptoms suddenly get worse.  You have facial pain, redness, or swelling. MAKE SURE YOU:   Understand these instructions.  Will watch your condition.  Will get help right away if you are not doing well or get worse.   This information is not intended to replace advice given to you by your health care provider. Make sure you discuss any questions you have with your health care provider.   Document Released: 04/02/2005 Document Revised: 04/23/2014 Document Reviewed: 09/03/2011 Elsevier Interactive Patient Education [employer].

## 2016-01-12 NOTE — Progress Notes (Signed)
By signing my name below, I, [name], attest that this documentation has been prepared under the direction and in the presence of Reginia Forts, MD.  Electronically Signed: Verlee Monte, Medical Scribe. 01/12/2016. 11:14 AM.  Subjective:    Patient ID: [name], female    DOB: [birth year], 65 y.o.   MRN: [id number]   01/12/2016  Eye Pain (redness, swollen and watery onset last night)   HPI  HPI Comments: [name] is a 65 y.o. female who presents to the [facility] complaining of right eye discomfort onset last night. Pt reports associated symptoms of "having a glaze" over her eyes, eye redness, eyelid swelling, and eye watering all occurring this morning. Pt wears eye glasses. 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 Pt's optometrist is in [place]. Pt has batteling allergies, which usually doesn't effects her eyes, has given her a HA, rhinorrhea, sinus pressure, postnasal drip, cough, and nasal congestion. Pt has taken flonase for her symptoms. Pt denies exposure to sick contacts with pink eye. Pt denies eye discharge, eye itchiness, eye pain, photophobia, and fever.  Pt has an appt with the oncologist tomorrow for breast cancer follow-up.    Visual Acuity Screening   Right eye Left eye Both eyes  Without correction:     With correction: 20/20 20/20 20/20     Review of Systems  HENT: Positive for congestion, postnasal drip, rhinorrhea and sinus pressure.   Eyes: Positive for redness and visual disturbance (from eyelid swelling). Negative for photophobia, pain, discharge and itching.  Respiratory: Positive for cough.   Allergic/Immunologic: Positive for environmental allergies.  Neurological: Positive for headaches.    Past Medical History:  Diagnosis Date  .  Allergy    seasonal  . Breast cancer ([place])    right mastectomy-2008  . Cancer ([place])    Breast Stage IIx2000  . Hyperlipidemia   . Hypertension   . Migraines    Past Surgical History:  Procedure Laterality Date  . ABDOMINAL HYSTERECTOMY  03/16/2012   uterine prolapse.  Ovaries resected.   [facility].  . COLONOSCOPY  04/17/2007   normal; repeat in 10 years.  [facility]  . COLPOPEXY  05/17/13   [facility]  . MASTECTOMY MODIFIED RADICAL  2008   had chemo   Allergies  Allergen Reactions  . Eucalyptus Oil Anaphylaxis  . Other Shortness Of Breath    Euculyptus - causes shortness of breath  . Codeine Other (See Comments)    hallunication  . Eucalyptus Flavor [Flavoring Agent] Other (See Comments)    Causes difficulty in breathing    Social History   Social History  . Marital status: Married    Spouse name: N/A  . Number of children: 1  . Years of education: N/A   Occupational History  . 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 Smoking status: Never Smoker  . Smokeless tobacco: Never Used  . Alcohol use No  . Drug use: No  . Sexual activity: Yes    Birth control/ protection: Post-menopausal, Surgical   Other Topics Concern  . Not on file   Social History Narrative   Marital status:  Married x 35  years; happily married; no abuse.      Children: one daughter (66) [place]; no grandchildren.      Lives: with husband in [place].      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  Working at [employer] 20 hours per week.      Tobacco:  Never.      Alcohol: none      Drugs: none      Exercise:  Job very physical; walking during the week.  Walking on walking track 3 days per week.      Seatbelt:  100%      Guns: loaded; unsecured.      Sunscreen:  None; rare sun exposure.   Family History  Problem Relation Age of Onset  . Stroke Mother     TIAs in  74s.  . Hypertension Mother   . Heart disease Father 79    AMI age 32  . Hypertension Father   . Drug abuse Sister   . Cancer Sister     CLL  . Cervical cancer Sister   . Cancer Sister     Cervical cancer       Objective:    BP 140/86 (BP Location: Left Arm, Patient Position: Sitting, Cuff Size: Normal)   Pulse 71   Temp 98.1 F (36.7 C) (Oral)   Resp 17   Ht 5\' 11"  (1.803 m)   Wt 176 lb (79.8 kg)   SpO2 99%   BMI 24.55 kg/m  Physical Exam  Constitutional: She appears well-developed and well-nourished. No distress.  HENT:  Head: Normocephalic and atraumatic.  Right Ear: External ear and ear canal normal. Tympanic membrane is retracted.  Left Ear: External ear and ear canal normal. Tympanic membrane is retracted.  Nose: Rhinorrhea present.  Mouth/Throat: Uvula is midline and mucous membranes are normal. Posterior oropharyngeal erythema present. No oropharyngeal exudate, posterior oropharyngeal edema or tonsillar abscesses.  Eyes: EOM are normal. Pupils are equal, round, and reactive to light. Right eye exhibits discharge. Right eye exhibits no exudate and no hordeolum. No foreign body present in the right eye. Left eye exhibits no chemosis, no discharge, no exudate and no hordeolum. No foreign body present in the left eye. Right conjunctiva is injected. Right conjunctiva has no hemorrhage. Left conjunctiva is not injected. Left conjunctiva has no hemorrhage.  Skin: She is not diaphoretic.        Assessment & Plan:   1. Acute conjunctivitis, right eye   2. Seasonal allergic rhinitis due to pollen   3. Acute recurrent maxillary sinusitis     -Polytrim 2 drops to right eye 4x a day for a week - sent in for refill of zyrtec - amoxicillin for sinusitis   No orders of the defined types were placed in this encounter.  Meds ordered this encounter  Medications  . DISCONTD: cetirizine (ZYRTEC) 10 MG tablet    Sig: Take 10 mg by mouth daily.  Marland Kitchen trimethoprim-polymyxin b  (POLYTRIM) ophthalmic solution    Sig: Place 2 drops into the right eye every 6 (six) hours.    Dispense:  10 mL    Refill:  0  . cetirizine (ZYRTEC) 10 MG tablet    Sig: Take 1 tablet (10 mg total) by mouth daily.    Dispense:  90 tablet    Refill:  3  . amoxicillin (AMOXIL) 500 MG capsule    Sig: Take 2 capsules (1,000 mg total) by mouth 2 (two) times daily.  Dispense:  40 capsule    Refill:  0    No Follow-up on file.  I personally performed the services described in this documentation, which was scribed in my presence. The recorded information has been reviewed and considered.  Raylon Lamson Elayne Guerin, M.D. [facility] [address] [place], [place]  [address] [phone] phone [phone] fax

## 2016-01-13 ENCOUNTER — Inpatient Hospital Stay (HOSPITAL_BASED_OUTPATIENT_CLINIC_OR_DEPARTMENT_OTHER): Payer: BC Managed Care – PPO | Admitting: Oncology

## 2016-01-13 ENCOUNTER — Inpatient Hospital Stay: Payer: BC Managed Care – PPO | Attending: Oncology

## 2016-01-13 VITALS — BP 124/84 | HR 73 | Temp 97.7°F | Wt 175.7 lb

## 2016-01-13 DIAGNOSIS — Z853 Personal history of malignant neoplasm of breast: Secondary | ICD-10-CM | POA: Diagnosis not present

## 2016-01-13 DIAGNOSIS — Z79899 Other long term (current) drug therapy: Secondary | ICD-10-CM

## 2016-01-13 DIAGNOSIS — Z806 Family history of leukemia: Secondary | ICD-10-CM

## 2016-01-13 DIAGNOSIS — Z9221 Personal history of antineoplastic chemotherapy: Secondary | ICD-10-CM | POA: Insufficient documentation

## 2016-01-13 DIAGNOSIS — I10 Essential (primary) hypertension: Secondary | ICD-10-CM | POA: Diagnosis not present

## 2016-01-13 DIAGNOSIS — E785 Hyperlipidemia, unspecified: Secondary | ICD-10-CM | POA: Diagnosis not present

## 2016-01-13 DIAGNOSIS — Z8669 Personal history of other diseases of the nervous system and sense organs: Secondary | ICD-10-CM | POA: Diagnosis not present

## 2016-01-13 DIAGNOSIS — D473 Essential (hemorrhagic) thrombocythemia: Secondary | ICD-10-CM

## 2016-01-13 DIAGNOSIS — Z7982 Long term (current) use of aspirin: Secondary | ICD-10-CM | POA: Insufficient documentation

## 2016-01-13 DIAGNOSIS — Z8049 Family history of malignant neoplasm of other genital organs: Secondary | ICD-10-CM | POA: Insufficient documentation

## 2016-01-13 DIAGNOSIS — C50911 Malignant neoplasm of unspecified site of right female breast: Secondary | ICD-10-CM

## 2016-01-13 DIAGNOSIS — Z9011 Acquired absence of right breast and nipple: Secondary | ICD-10-CM

## 2016-01-13 LAB — HEPATIC FUNCTION PANEL
ALT: 12 U/L — AB (ref 14–54)
AST: 15 U/L (ref 15–41)
Albumin: 4.6 g/dL (ref 3.5–5.0)
Alkaline Phosphatase: 58 U/L (ref 38–126)
BILIRUBIN TOTAL: 0.9 mg/dL (ref 0.3–1.2)
Total Protein: 7.9 g/dL (ref 6.5–8.1)

## 2016-01-13 LAB — CREATININE, SERUM
CREATININE: 0.67 mg/dL (ref 0.44–1.00)
GFR calc non Af Amer: >60 mL/min (ref >60)

## 2016-01-13 NOTE — Progress Notes (Signed)
Patient ambulates without assistance, brought to exam room 4.  Patient denies pain or discomfort at this time. Vitals stable and documented

## 2016-01-14 LAB — CANCER ANTIGEN 27.29: CA 27.29: 17.8 U/mL (ref 0.0–38.6)

## 2016-03-31 ENCOUNTER — Other Ambulatory Visit: Payer: Self-pay | Admitting: Family Medicine

## 2016-04-23 ENCOUNTER — Other Ambulatory Visit: Payer: Self-pay | Admitting: Family Medicine

## 2016-04-23 DIAGNOSIS — E7800 Pure hypercholesterolemia, unspecified: Secondary | ICD-10-CM

## 2016-05-01 ENCOUNTER — Ambulatory Visit (INDEPENDENT_AMBULATORY_CARE_PROVIDER_SITE_OTHER): Payer: BC Managed Care – PPO

## 2016-05-01 ENCOUNTER — Ambulatory Visit (INDEPENDENT_AMBULATORY_CARE_PROVIDER_SITE_OTHER): Payer: BC Managed Care – PPO | Admitting: Family Medicine

## 2016-05-01 ENCOUNTER — Encounter: Payer: Self-pay | Admitting: Family Medicine

## 2016-05-01 VITALS — BP 135/83 | HR 77 | Temp 98.4°F | Resp 16 | Ht 70.25 in | Wt 176.4 lb

## 2016-05-01 DIAGNOSIS — J9801 Acute bronchospasm: Secondary | ICD-10-CM | POA: Diagnosis not present

## 2016-05-01 DIAGNOSIS — J069 Acute upper respiratory infection, unspecified: Secondary | ICD-10-CM

## 2016-05-01 MED ORDER — ALBUTEROL SULFATE HFA 108 (90 BASE) MCG/ACT IN AERS
2.0000 | INHALATION_SPRAY | Freq: Four times a day (QID) | RESPIRATORY_TRACT | 0 refills | Status: DC | PRN
Start: 2016-05-01 — End: 2016-05-01

## 2016-05-01 MED ORDER — ALBUTEROL SULFATE HFA 108 (90 BASE) MCG/ACT IN AERS: 2.0000 | INHALATION_SPRAY | Freq: Four times a day (QID) | RESPIRATORY_TRACT | 0 refills | Status: DC | PRN

## 2016-05-01 NOTE — Progress Notes (Signed)
Subjective:    Patient ID: [name], female    DOB: [name], 1952, 66 y.o.   MRN: [id number]  05/01/2016  CHEST CONGESTION (with chills x 1 week -per patient want to be check) and Wheezing   HPI This 66 y.o. female presents for evaluation of one week history of chest congestion, body aches, chills, wheezes.  Onset one week ago.  Horrible chills at night.  Two nights of chills. Sweats as well.  Body aches.  Muscles are still sore.  No chills in one week.  +HA. No ear pain.  No sore throat.  +rhinorrhea; +nasal congestion; coughing is horrible.  Nasal congestion white.  No sinus pressure.  +sputum production; wheezing; scared patient; kept moving.   No n/v.  No shortness of breath.  Decreased appetite.  Taking Tylenol Sinus; then took Advil.  No history of wheezing.  No history of wheezing.  History of breast cancer.   Review of Systems  Constitutional: Positive for chills, diaphoresis, fatigue and fever.  HENT: Positive for congestion, postnasal drip and rhinorrhea. Negative for ear pain, sinus pressure, sore throat and trouble swallowing.   Respiratory: Positive for cough and wheezing. Negative for shortness of breath.   Cardiovascular: Negative for chest pain, palpitations and leg swelling.  Gastrointestinal: Negative for abdominal pain, constipation, diarrhea, nausea and vomiting.  Neurological: Positive for headaches.    Past Medical History:  Diagnosis Date  . Allergy    seasonal  . Breast cancer ([place])    right mastectomy-2008  . Cancer ([place])    Breast Stage IIx2000  . Hyperlipidemia   . Hypertension   . Migraines    Past Surgical History:  Procedure Laterality Date  . ABDOMINAL HYSTERECTOMY  03/16/2012   uterine prolapse.  Ovaries resected.   [facility].  . COLONOSCOPY  04/17/2007   normal; repeat in 10 years.  [facility]  . [facility]EXY  05/17/13   [facility]  . MASTECTOMY MODIFIED RADICAL  2008   had chemo   Allergies  Allergen Reactions  . Eucalyptus Oil  Anaphylaxis  . Other Shortness Of Breath    Euculyptus - causes shortness of breath  . Codeine Other (See Comments)    hallunication  . Eucalyptus Flavor [Flavoring Agent] Other (See Comments)    Causes difficulty in breathing    Social History   Social History  . Marital status: Married    Spouse name: N/A  . Number of children: 1  . Years of education: N/A   Occupational History  . 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 Smoking status: Never Smoker  . Smokeless tobacco: Never Used  . Alcohol use No  . Drug use: No  . Sexual activity: Yes    Birth control/ protection: Post-menopausal, Surgical   Other Topics Concern  . Not on file   Social History Narrative   Marital status:  Married x 35 years; happily married; no abuse.      Children: one daughter (41) [place]; no grandchildren.      Lives: with husband in [place].      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  Working at [employer] 20 hours per week.      Tobacco:  Never.      Alcohol: none      Drugs: none      Exercise:  Job very physical; walking during  the week.  Walking on walking track 3 days per week.      Seatbelt:  100%      Guns: loaded; unsecured.      Sunscreen:  None; rare sun exposure.   Family History  Problem Relation Age of Onset  . Stroke Mother     TIAs in 74s.  . Hypertension Mother   . Heart disease Father 71    AMI age 24  . Hypertension Father   . Drug abuse Sister   . Cancer Sister     CLL  . Cervical cancer Sister   . Cancer Sister     Cervical cancer       Objective:    BP 135/83 (BP Location: Right Arm, Patient Position: Standing, Cuff Size: Normal)   Pulse 77   Temp 98.4 F (36.9 C) (Oral)   Resp 16   Ht 5' 10.25" (1.784 m)   Wt 176 lb 6.4 oz (80 kg)   SpO2 96%   BMI 25.13 kg/m  Physical Exam  Constitutional: She is oriented to person,  place, and time. She appears well-developed and well-nourished. No distress.  HENT:  Head: Normocephalic and atraumatic.  Right Ear: External ear normal.  Left Ear: External ear normal.  Nose: Nose normal.  Mouth/Throat: Uvula is midline, oropharynx is clear and moist and mucous membranes are normal.  Eyes: Conjunctivae are normal. Pupils are equal, round, and reactive to light.  Neck: Normal range of motion. Neck supple.  Cardiovascular: Normal rate, regular rhythm and normal heart sounds.  Exam reveals no gallop and no friction rub.   No murmur heard. Pulmonary/Chest: Effort normal and breath sounds normal. She has no wheezes. She has no rales.  Neurological: She is alert and oriented to person, place, and time.  Skin: She is not diaphoretic.  Psychiatric: She has a normal mood and affect. Her behavior is normal.  Nursing note and vitals reviewed.      Assessment & Plan:   1. Bronchospasm   2. Acute upper respiratory infection    -New.  -obtain CXR due to history of breast cancer. -rx for Albuterol provided.   -continue OTC medications.   Orders Placed This Encounter  Procedures  . DG Chest 2 View    Standing Status:   Future    Number of Occurrences:   1    Standing Expiration Date:   05/01/2017    Order Specific Question:   Reason for Exam (SYMPTOM  OR DIAGNOSIS REQUIRED)    Answer:   WHEEZING, COUGH, CHILLS; history of breast cancer    Order Specific Question:   Preferred imaging location?    Answer:   External   Meds ordered this encounter  Medications  . DISCONTD: albuterol (PROVENTIL HFA;VENTOLIN HFA) 108 (90 Base) MCG/ACT inhaler    Sig: Inhale 2 puffs into the lungs every 6 (six) hours as needed for wheezing or shortness of breath (cough, shortness of breath or wheezing.).    Dispense:  1 Inhaler    Refill:  0  . albuterol (PROVENTIL HFA;VENTOLIN HFA) 108 (90 Base) MCG/ACT inhaler    Sig: Inhale 2 puffs into the lungs every 6 (six) hours as needed for wheezing  or shortness of breath (cough, shortness of breath or wheezing.).    Dispense:  1 Inhaler    Refill:  0    No Follow-up on file.   [name], M.D. [facility] [place], [place]  [address] [phone] phone [phone] fax

## 2016-05-01 NOTE — Patient Instructions (Addendum)
   IF you received an x-ray today, you will receive an invoice from [facility]. Please contact [facility] at [phone] with questions or concerns regarding your invoice.   IF you received labwork today, you will receive an invoice from [facility]. Please contact [facility] at [phone] with questions or concerns regarding your invoice.   Our billing staff will not be able to assist you with questions regarding bills from these companies.  You will be contacted with the lab results as soon as they are available. The fastest way to get your results is to activate your My Chart account. Instructions are located on the last page of this paperwork. If you have not heard from us regarding the results in 2 weeks, please contact this office.     Bronchospasm, Adult A bronchospasm is a spasm or tightening of the airways going into the lungs. During a bronchospasm breathing becomes more difficult because the airways get smaller. When this happens there can be coughing, a whistling sound when breathing (wheezing), and difficulty breathing. Bronchospasm is often associated with asthma, but not all patients who experience a bronchospasm have asthma. What are the causes? A bronchospasm is caused by inflammation or irritation of the airways. The inflammation or irritation may be triggered by:  Allergies (such as to animals, pollen, food, or mold). Allergens that cause bronchospasm may cause wheezing immediately after exposure or many hours later.  Infection. Viral infections are believed to be the most common cause of bronchospasm.  Exercise.  Irritants (such as pollution, cigarette smoke, strong odors, aerosol sprays, and paint fumes).  Weather changes. Winds increase molds and pollens in the air. Rain refreshes the air by washing irritants out. Cold air may cause inflammation.  Stress and emotional upset. What are the signs or symptoms?  Wheezing.  Excessive nighttime  coughing.  Frequent or severe coughing with a simple cold.  Chest tightness.  Shortness of breath. How is this diagnosed? Bronchospasm is usually diagnosed through a history and physical exam. Tests, such as chest X-rays, are sometimes done to look for other conditions. How is this treated?  Inhaled medicines can be given to open up your airways and help you breathe. The medicines can be given using either an inhaler or a nebulizer machine.  Corticosteroid medicines may be given for severe bronchospasm, usually when it is associated with asthma. Follow these instructions at home:  Always have a plan prepared for seeking medical care. Know when to call your health care provider and local emergency services (911 in the [place] Know where you can access local emergency care.  Only take medicines as directed by your health care provider.  If you were prescribed an inhaler or nebulizer machine, ask your health care provider to explain how to use it correctly. Always use a spacer with your inhaler if you were given one.  It is necessary to remain calm during an attack. Try to relax and breathe more slowly.  Control your home environment in the following ways:  Change your heating and air conditioning filter at least once a month.  Limit your use of fireplaces and wood stoves.  Do not smoke and do not allow smoking in your home.  Avoid exposure to perfumes and fragrances.  Get rid of pests (such as roaches and mice) and their droppings.  Throw away plants if you see mold on them.  Keep your house clean and dust free.  Replace carpet with wood, tile, or vinyl flooring. Carpet can trap dander   and dust.  Use allergy-proof pillows, mattress covers, and box spring covers.  Wash bed sheets and blankets every week in hot water and dry them in a dryer.  Use blankets that are made of polyester or cotton.  Wash hands frequently. Contact a health care provider if:  You have muscle  aches.  You have chest pain.  The sputum changes from clear or white to yellow, green, gray, or bloody.  The sputum you cough up gets thicker.  There are problems that may be related to the medicine you are given, such as a rash, itching, swelling, or trouble breathing. Get help right away if:  You have worsening wheezing and coughing even after taking your prescribed medicines.  You have increased difficulty breathing.  You develop severe chest pain. This information is not intended to replace advice given to you by your health care provider. Make sure you discuss any questions you have with your health care provider. Document Released: 04/05/2003 Document Revised: 09/08/2015 Document Reviewed: 09/22/2012 Elsevier Interactive Patient Education  2017 Elsevier Inc.  

## 2016-05-22 ENCOUNTER — Encounter: Payer: BC Managed Care – PPO | Admitting: Family Medicine

## 2016-06-05 ENCOUNTER — Other Ambulatory Visit: Payer: Self-pay | Admitting: Family Medicine

## 2016-06-05 DIAGNOSIS — I10 Essential (primary) hypertension: Secondary | ICD-10-CM

## 2016-06-07 ENCOUNTER — Other Ambulatory Visit: Payer: Self-pay | Admitting: Family Medicine

## 2016-06-07 DIAGNOSIS — I10 Essential (primary) hypertension: Secondary | ICD-10-CM

## 2016-06-07 DIAGNOSIS — J301 Allergic rhinitis due to pollen: Secondary | ICD-10-CM

## 2016-06-19 ENCOUNTER — Ambulatory Visit (INDEPENDENT_AMBULATORY_CARE_PROVIDER_SITE_OTHER): Payer: BC Managed Care – PPO | Admitting: Family Medicine

## 2016-06-19 ENCOUNTER — Encounter: Payer: Self-pay | Admitting: Family Medicine

## 2016-06-19 VITALS — BP 137/84 | HR 80 | Temp 97.9°F | Resp 18 | Ht 70.25 in | Wt 177.0 lb

## 2016-06-19 DIAGNOSIS — J301 Allergic rhinitis due to pollen: Secondary | ICD-10-CM

## 2016-06-19 DIAGNOSIS — C50411 Malignant neoplasm of upper-outer quadrant of right female breast: Secondary | ICD-10-CM

## 2016-06-19 DIAGNOSIS — Z0000 Encounter for general adult medical examination without abnormal findings: Secondary | ICD-10-CM | POA: Diagnosis not present

## 2016-06-19 DIAGNOSIS — Z170 Estrogen receptor positive status [ER+]: Secondary | ICD-10-CM

## 2016-06-19 DIAGNOSIS — Z6825 Body mass index (BMI) 25.0-25.9, adult: Secondary | ICD-10-CM

## 2016-06-19 DIAGNOSIS — K5901 Slow transit constipation: Secondary | ICD-10-CM | POA: Diagnosis not present

## 2016-06-19 DIAGNOSIS — E7800 Pure hypercholesterolemia, unspecified: Secondary | ICD-10-CM | POA: Diagnosis not present

## 2016-06-19 DIAGNOSIS — I10 Essential (primary) hypertension: Secondary | ICD-10-CM

## 2016-06-19 DIAGNOSIS — R7302 Impaired glucose tolerance (oral): Secondary | ICD-10-CM | POA: Diagnosis not present

## 2016-06-19 LAB — POCT URINALYSIS DIP (MANUAL ENTRY)
Bilirubin, UA: NEGATIVE
GLUCOSE UA: NEGATIVE
Ketones, POC UA: NEGATIVE
Leukocytes, UA: NEGATIVE
NITRITE UA: NEGATIVE
Protein Ur, POC: NEGATIVE
RBC UA: NEGATIVE
Spec Grav, UA: 1.01
UROBILINOGEN UA: 0.2
pH, UA: 7

## 2016-06-19 MED ORDER — LISINOPRIL 10 MG PO TABS: 10.0000 mg | ORAL_TABLET | Freq: Every day | ORAL | 3 refills | Status: DC

## 2016-06-19 MED ORDER — SIMVASTATIN 10 MG PO TABS: 10.0000 mg | ORAL_TABLET | Freq: Every evening | ORAL | 3 refills | Status: DC

## 2016-06-19 MED ORDER — HYDROCHLOROTHIAZIDE 25 MG PO TABS: 25.0000 mg | ORAL_TABLET | Freq: Every day | ORAL | 3 refills | Status: DC

## 2016-06-19 NOTE — Patient Instructions (Addendum)
   IF you received an x-ray today, you will receive an invoice from [facility]. Please contact [facility] at [phone] with questions or concerns regarding your invoice.   IF you received labwork today, you will receive an invoice from [facility]. Please contact [facility] at [phone] with questions or concerns regarding your invoice.   Our billing staff will not be able to assist you with questions regarding bills from these companies.  You will be contacted with the lab results as soon as they are available. The fastest way to get your results is to activate your My Chart account. Instructions are located on the last page of this paperwork. If you have not heard from us regarding the results in 2 weeks, please contact this office.    cpeKeeping You Healthy  Get These Tests  Blood Pressure- Have your blood pressure checked by your healthcare provider at least once a year.  Normal blood pressure is 120/80.  Weight- Have your body mass index (BMI) calculated to screen for obesity.  BMI is a measure of body fat based on height and weight.  You can calculate your own BMI at www.nhlbisupport.com/bmi/  Cholesterol- Have your cholesterol checked every year.  Diabetes- Have your blood sugar checked every year if you have high blood pressure, high cholesterol, a family history of diabetes or if you are overweight.  Pap Test - Have a pap test every 1 to 5 years if you have been sexually active.  If you are older than 65 and recent pap tests have been normal you may not need additional pap tests.  In addition, if you have had a hysterectomy  for benign disease additional pap tests are not necessary.  Mammogram-Yearly mammograms are essential for early detection of breast cancer  Screening for Colon Cancer- Colonoscopy starting at age 50. Screening may begin sooner depending on your family history and other health conditions.  Follow up colonoscopy as directed by your  Gastroenterologist.  Screening for Osteoporosis- Screening begins at age 65 with bone density scanning, sooner if you are at higher risk for developing Osteoporosis.  Get these medicines  Calcium with Vitamin D- Your body requires 1200-1500 mg of Calcium a day and 800-1000 IU of Vitamin D a day.  You can only absorb 500 mg of Calcium at a time therefore Calcium must be taken in 2 or 3 separate doses throughout the day.  Hormones- Hormone therapy has been associated with increased risk for certain cancers and heart disease.  Talk to your healthcare provider about if you need relief from menopausal symptoms.  Aspirin- Ask your healthcare provider about taking Aspirin to prevent Heart Disease and Stroke.  Get these Immuniztions  Flu shot- Every fall  Pneumonia shot- Once after the age of 65; if you are younger ask your healthcare provider if you need a pneumonia shot.  Tetanus- Every ten years.  Zostavax- Once after the age of 60 to prevent shingles.  Take these steps  Don't smoke- Your healthcare provider can help you quit. For tips on how to quit, ask your healthcare provider or go to www.smokefree.gov or call 1-800 QUIT-NOW.  Be physically active- Exercise 5 days a week for a minimum of 30 minutes.  If you are not already physically active, start slow and gradually work up to 30 minutes of moderate physical activity.  Try walking, dancing, bike riding, swimming, etc.  Eat a healthy diet- Eat a variety of healthy foods such as fruits, vegetables, whole grains, low fat   milk, low fat cheeses, yogurt, lean meats, chicken, fish, eggs, dried beans, tofu, etc.  For more information go to www.thenutritionsource.org  Dental visit- Brush and floss teeth twice daily; visit your dentist twice a year.  Eye exam- Visit your Optometrist or Ophthalmologist yearly.  Drink alcohol in moderation- Limit alcohol intake to one drink or less a day.  Never drink and drive.  Depression- Your emotional  health is as important as your physical health.  If you're feeling down or losing interest in things you normally enjoy, please talk to your healthcare provider.  Seat Belts- can save your life; always wear one  Smoke/Carbon Monoxide detectors- These detectors need to be installed on the appropriate level of your home.  Replace batteries at least once a year.  Violence- If anyone is threatening or hurting you, please tell your healthcare provider. Living Will/ Health care power of attorney- Discuss with your healthcare provider and family. 

## 2016-06-19 NOTE — Progress Notes (Signed)
Subjective:    Patient ID: [name], female    DOB: 1950/05/11, 66 y.o.   MRN: [id number]  06/19/2016  Annual Exam   HPI This 66 y.o. female presents for Complete Physical Examination.  Last physical:  03-18-2015 Pap smear:  hysterectomy Mammogram:  09-06-15 ; scheduled in May 2018 Colonoscopy:  2009; [facility]; [place] Bone density:  2014 Eye exam:  Phillip Heal; glasses. [place]. Dental exam:  Every six months.  [place].   Immunization History  Administered Date(s) Administered  . Influenza,inj,Quad PF,36+ Mos 02/24/2013, 03/10/2014, 01/20/2015, 12/20/2015  . Tdap 10/20/2008  . Zoster 02/15/2012   BP Readings from Last 3 Encounters:  06/19/16 137/84  05/01/16 135/83  01/13/16 124/84   Wt Readings from Last 3 Encounters:  06/19/16 177 lb (80.3 kg)  05/01/16 176 lb 6.4 oz (80 kg)  01/13/16 175 lb 11.3 oz (79.7 kg)     Review of Systems  Constitutional: Negative for activity change, appetite change, chills, diaphoresis, fatigue, fever and unexpected weight change.  HENT: Negative for congestion, dental problem, drooling, ear discharge, ear pain, facial swelling, hearing loss, mouth sores, nosebleeds, postnasal drip, rhinorrhea, sinus pressure, sneezing, sore throat, tinnitus, trouble swallowing and voice change.   Eyes: Negative for photophobia, pain, discharge, redness, itching and visual disturbance.  Respiratory: Negative for apnea, cough, choking, chest tightness, shortness of breath, wheezing and stridor.   Cardiovascular: Negative for chest pain, palpitations and leg swelling.  Gastrointestinal: Negative for abdominal distention, abdominal pain, anal bleeding, blood in stool, constipation, diarrhea, nausea, rectal pain and vomiting.  Endocrine: Negative for cold intolerance, heat intolerance, polydipsia, polyphagia and polyuria.  Genitourinary: Negative for decreased urine volume, difficulty urinating, dyspareunia, dysuria, enuresis, flank pain, frequency,  genital sores, hematuria, menstrual problem, pelvic pain, urgency, vaginal bleeding, vaginal discharge and vaginal pain.       Nocturia x 2.  Urinary leakage YES; small amount.   Incomplete emptying.  Musculoskeletal: Negative for arthralgias, back pain, gait problem, joint swelling, myalgias, neck pain and neck stiffness.  Skin: Negative for color change, pallor, rash and wound.  Allergic/Immunologic: Negative for environmental allergies, food allergies and immunocompromised state.  Neurological: Negative for dizziness, tremors, seizures, syncope, facial asymmetry, speech difficulty, weakness, light-headedness, numbness and headaches.  Hematological: Negative for adenopathy. Does not bruise/bleed easily.  Psychiatric/Behavioral: Negative for agitation, behavioral problems, confusion, decreased concentration, dysphoric mood, hallucinations, self-injury, sleep disturbance and suicidal ideas. The patient is not nervous/anxious and is not hyperactive.        Bedtime 10p; wakes up 7a.    Past Medical History:  Diagnosis Date  . Allergy    seasonal  . Breast cancer ([place])    right mastectomy-2008  . Cancer ([place])    Breast Stage IIx2000  . Hyperlipidemia   . Hypertension   . Migraines    Past Surgical History:  Procedure Laterality Date  . ABDOMINAL HYSTERECTOMY  03/16/2012   uterine prolapse.  Ovaries resected.   [facility].  Marland [facility] BREAST LUMPECTOMY WITH RADIOACTIVE SEED AND AXILLARY LYMPH NODE DISSECTION Right   . COLONOSCOPY  04/17/2007   normal; repeat in 10 years.  [facility].  . COLPOPEXY  05/17/13   [facility]  . MASTECTOMY MODIFIED RADICAL Right    s/p chemotherapy   Allergies  Allergen Reactions  . Eucalyptus Oil Anaphylaxis  . Other Shortness Of Breath    Euculyptus - causes shortness of breath  . Codeine Other (See Comments)    hallunication  . Eucalyptus Flavor [Flavoring Agent] Other (See Comments)  Causes difficulty in breathing    Social History   Social History    . Marital status: Married    Spouse name: N/A  . Number of children: 1  . Years of education: N/A   Occupational History  . 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 Smoking status: Never Smoker  . Smokeless tobacco: Never Used  . Alcohol use No  . Drug use: No  . Sexual activity: Yes    Birth control/ protection: Post-menopausal, Surgical   Other Topics Concern  . Not on file   Social History Narrative   Marital status:  Married x 36 years; happily married; no abuse.      Children: one daughter (41) [place]; no grandchildren.      Lives: with husband in [place].      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  Working at [employer] 20 hours per week.      Tobacco:  Never.      Alcohol: none      Drugs: none      Exercise:  Job very physical; walking during the week.  Walking minimally in 2018.      Seatbelt:  100%; no texting while driving.      Guns: loaded; unsecured.      Sunscreen:  None; rare sun exposure.   Family History  Problem Relation Age of Onset  . Stroke Mother     TIAs in 59s.  . Hypertension Mother   . Heart disease Father 61    AMI age 36  . Hypertension Father   . Drug abuse Sister   . Cancer Sister     CLL  . Cervical cancer Sister   . Cancer Sister     Cervical cancer       Objective:    BP 137/84   Pulse 80   Temp 97.9 F (36.6 C) (Oral)   Resp 18   Ht 5' 10.25" (1.784 m)   Wt 177 lb (80.3 kg)   SpO2 98%   BMI 25.22 kg/m  Physical Exam  Constitutional: She is oriented to person, place, and time. She appears well-developed and well-nourished. No distress.  HENT:  Head: Normocephalic and atraumatic.  Right Ear: External ear normal.  Left Ear: External ear normal.  Nose: Nose normal.  Mouth/Throat: Oropharynx is clear and moist.  Eyes: Conjunctivae and EOM are normal. Pupils are equal, round, and reactive to  light.  Neck: Normal range of motion and full passive range of motion without pain. Neck supple. No JVD present. Carotid bruit is not present. No thyromegaly present.  Cardiovascular: Normal rate, regular rhythm, normal heart sounds and intact distal pulses.  Exam reveals no gallop and no friction rub.   No murmur heard. Pulmonary/Chest: Effort normal and breath sounds normal. She has no wheezes. She has no rales. Left breast exhibits no inverted nipple, no mass, no nipple discharge, no skin change and no tenderness. Breasts are asymmetrical.  s/p R radical mastectomy.  Scars well healed.  No masses along chest wall R.    Abdominal: Soft. Bowel sounds are normal. She exhibits no distension and no mass. There is no tenderness. There is no rebound and no guarding.  Musculoskeletal:       Right shoulder: Normal.       Left shoulder: Normal.  Cervical back: Normal.  Lymphadenopathy:    She has no cervical adenopathy.  Neurological: She is alert and oriented to person, place, and time. She has normal reflexes. No cranial nerve deficit. She exhibits normal muscle tone. Coordination normal.  Skin: Skin is warm and dry. No rash noted. She is not diaphoretic. No erythema. No pallor.  Psychiatric: She has a normal mood and affect. Her behavior is normal. Judgment and thought content normal.  Nursing note and vitals reviewed.   Depression screen [facility] 2/9 06/19/2016 05/01/2016 01/12/2016 12/20/2015 03/18/2015  Decreased Interest 0 0 0 0 0  Down, Depressed, Hopeless 0 0 0 0 0  PHQ - 2 Score 0 0 0 0 0   Fall Risk  06/19/2016 05/01/2016 12/20/2015 03/18/2015 03/10/2014  Falls in the past year? No No No No No        Assessment & Plan:   1. Routine physical examination   2. Essential hypertension, benign   3. Chronic seasonal allergic rhinitis due to pollen   4. Slow transit constipation   5. Glucose intolerance (impaired glucose tolerance)   6. Pure hypercholesterolemia   7. Malignant neoplasm of  upper-outer quadrant of right breast in female, estrogen receptor positive ([place])   8. BMI 25.0-25.9,adult    -anticipatory guidance --- exercise, weight loss, ASA 81mg  daily, and calcium 1200mg  daily. -obtain labs for chronic disease management and age appropriate screening. -refills provided.   Orders Placed This Encounter  Procedures  . CBC with Differential/Platelet  . Comprehensive metabolic panel    Order Specific Question:   Has the patient fasted?    Answer:   Yes  . Hemoglobin A1c  . Lipid panel    Order Specific Question:   Has the patient fasted?    Answer:   Yes  . TSH  . Microalbumin, urine  . POCT urinalysis dipstick   Meds ordered this encounter  Medications  . hydrochlorothiazide (HYDRODIURIL) 25 MG tablet    Sig: Take 1 tablet (25 mg total) by mouth daily.    Dispense:  90 tablet    Refill:  3  . lisinopril (PRINIVIL,ZESTRIL) 10 MG tablet    Sig: Take 1 tablet (10 mg total) by mouth daily.    Dispense:  90 tablet    Refill:  3  . simvastatin (ZOCOR) 10 MG tablet    Sig: Take 1 tablet (10 mg total) by mouth every evening.    Dispense:  90 tablet    Refill:  3    Return in about 6 months (around 12/20/2016) for recheck high bloo dpressure, high cholesterol.   Marny Smethers Elayne Guerin, M.D. Primary Care at [facility] previously [facility] [address] [place], [place]  [address] [phone] phone [phone] fax

## 2016-06-20 LAB — LIPID PANEL
CHOL/HDL RATIO: 2.4 ratio (ref 0.0–4.4)
Cholesterol, Total: 251 mg/dL — ABNORMAL HIGH (ref 100–199)
HDL: 105 mg/dL (ref >39)
LDL Calculated: 131 mg/dL — ABNORMAL HIGH (ref 0–99)
Triglycerides: 75 mg/dL (ref 0–149)
VLDL Cholesterol Cal: 15 mg/dL (ref 5–40)

## 2016-06-20 LAB — CBC WITH DIFFERENTIAL/PLATELET
BASOS ABS: 0.1 10*3/uL (ref 0.0–0.2)
Basos: 2 %
EOS (ABSOLUTE): 0.2 10*3/uL (ref 0.0–0.4)
EOS: 4 %
HEMATOCRIT: 38.6 % (ref 34.0–46.6)
HEMOGLOBIN: 13 g/dL (ref 11.1–15.9)
Immature Grans (Abs): 0 10*3/uL (ref 0.0–0.1)
Immature Granulocytes: 0 %
Lymphocytes Absolute: 1.4 10*3/uL (ref 0.7–3.1)
Lymphs: 25 %
MCH: 30.8 pg (ref 26.6–33.0)
MCHC: 33.7 g/dL (ref 31.5–35.7)
MCV: 92 fL (ref 79–97)
MONOCYTES: 9 %
MONOS ABS: 0.5 10*3/uL (ref 0.1–0.9)
NEUTROS ABS: 3.4 10*3/uL (ref 1.4–7.0)
Neutrophils: 60 %
Platelets: 435 10*3/uL — ABNORMAL HIGH (ref 150–379)
RBC: 4.22 x10E6/uL (ref 3.77–5.28)
RDW: 13.7 % (ref 12.3–15.4)
WBC: 5.6 10*3/uL (ref 3.4–10.8)

## 2016-06-20 LAB — COMPREHENSIVE METABOLIC PANEL
A/G RATIO: 2.3 — AB (ref 1.2–2.2)
ALBUMIN: 5 g/dL — AB (ref 3.6–4.8)
ALK PHOS: 65 IU/L (ref 39–117)
ALT: 24 IU/L (ref 0–32)
AST: 31 IU/L (ref 0–40)
BILIRUBIN TOTAL: 0.5 mg/dL (ref 0.0–1.2)
BUN/Creatinine Ratio: 22 (ref 12–28)
BUN: 16 mg/dL (ref 8–27)
CHLORIDE: 98 mmol/L (ref 96–106)
CO2: 24 mmol/L (ref 18–29)
Calcium: 9.5 mg/dL (ref 8.7–10.3)
Creatinine, Ser: 0.74 mg/dL (ref 0.57–1.00)
GFR calc Af Amer: 99 mL/min/{1.73_m2} (ref >59)
GFR calc non Af Amer: 86 mL/min/{1.73_m2} (ref >59)
Globulin, Total: 2.2 g/dL (ref 1.5–4.5)
Glucose: 87 mg/dL (ref 65–99)
POTASSIUM: 4.2 mmol/L (ref 3.5–5.2)
SODIUM: 140 mmol/L (ref 134–144)
Total Protein: 7.2 g/dL (ref 6.0–8.5)

## 2016-06-20 LAB — MICROALBUMIN, URINE

## 2016-06-20 LAB — TSH: TSH: 1.4 u[IU]/mL (ref 0.450–4.500)

## 2016-06-20 LAB — HEMOGLOBIN A1C
ESTIMATED AVERAGE GLUCOSE: 117 mg/dL
HEMOGLOBIN A1C: 5.7 % — AB (ref 4.8–5.6)

## 2016-07-10 ENCOUNTER — Encounter: Payer: Self-pay | Admitting: Family Medicine

## 2016-09-06 ENCOUNTER — Other Ambulatory Visit: Payer: Self-pay | Admitting: Oncology

## 2016-09-06 ENCOUNTER — Ambulatory Visit
Admission: RE | Admit: 2016-09-06 | Discharge: 2016-09-06 | Disposition: A | Discharge status: Home / Self Care | Payer: BC Managed Care – PPO | Source: Ambulatory Visit | Attending: Oncology | Admitting: Oncology

## 2016-09-06 DIAGNOSIS — Z1231 Encounter for screening mammogram for malignant neoplasm of breast: Secondary | ICD-10-CM | POA: Diagnosis present

## 2016-09-06 DIAGNOSIS — C50911 Malignant neoplasm of unspecified site of right female breast: Secondary | ICD-10-CM

## 2016-09-06 HISTORY — DX: Personal history of antineoplastic chemotherapy: Z92.21

## 2016-11-14 ENCOUNTER — Ambulatory Visit: Payer: BC Managed Care – PPO | Admitting: Family Medicine

## 2016-11-14 ENCOUNTER — Other Ambulatory Visit: Payer: Self-pay | Admitting: Family Medicine

## 2016-11-14 ENCOUNTER — Encounter: Payer: Self-pay | Admitting: Physician Assistant

## 2016-11-14 ENCOUNTER — Ambulatory Visit (INDEPENDENT_AMBULATORY_CARE_PROVIDER_SITE_OTHER): Payer: Medicare Other | Admitting: Physician Assistant

## 2016-11-14 VITALS — BP 142/82 | HR 75 | Temp 98.4°F | Resp 18 | Ht 70.47 in | Wt 182.0 lb

## 2016-11-14 DIAGNOSIS — H8113 Benign paroxysmal vertigo, bilateral: Secondary | ICD-10-CM

## 2016-11-14 DIAGNOSIS — H6981 Other specified disorders of Eustachian tube, right ear: Secondary | ICD-10-CM

## 2016-11-14 MED ORDER — CETIRIZINE HCL 10 MG PO TABS: 10.0000 mg | ORAL_TABLET | Freq: Every day | ORAL | 1 refills | Status: DC

## 2016-11-14 MED ORDER — MECLIZINE HCL 25 MG PO TABS: 25.0000 mg | ORAL_TABLET | Freq: Three times a day (TID) | ORAL | 0 refills | Status: AC | PRN

## 2016-11-14 NOTE — Progress Notes (Signed)
PRIMARY CARE AT [facility] [address] [place] [address] [phone]  Date:  11/14/2016   Name:  [name]   DOB:  March 13, 1951   MRN:  [id number]  PCP:  Wardell Honour, MD    History of Present Illness:  [name] is a 66 y.o. female patient who presents to PCP with  Chief Complaint  Patient presents with  . Dizziness    x 4 days      4 days ago, she has had dysequilibrium, and off balance which is intermittent.  This occurs any time during the day not associated with standing or moving.  Light may effect her symptoms.  When she is moving around, helps her symptoms.  No ear pain, but has pain where here glasses sit, but denies sinus pain.  No congestion, sore throat, or coughing.  No ear pain. Not associated with meals.  She has some nausea, polydipsia, or polyuria. No chest pains, palpitations, or sob.   She attempted 12.5 mg of meclizine which did not help much.   Wt Readings from Last 3 Encounters:  11/14/16 182 lb (82.6 kg)  06/19/16 177 lb (80.3 kg)  05/01/16 176 lb 6.4 oz (80 kg)     Patient Active Problem List   Diagnosis Date Noted  . Glucose intolerance (impaired glucose tolerance) 09/05/2014  . Constipation 02/24/2013  . Breast cancer, right ([place]) 02/24/2013  . Essential hypertension, benign 09/12/2012  . Allergic rhinitis 09/12/2012  . Pure hypercholesterolemia 09/12/2012    Past Medical History:  Diagnosis Date  . Allergy    seasonal  . Breast cancer ([place])    right mastectomy-2008  . Cancer ([place])    Breast Stage IIx2000  . Hyperlipidemia   . Hypertension   . Migraines   . Personal history of chemotherapy     Past Surgical History:  Procedure Laterality Date  . ABDOMINAL HYSTERECTOMY  03/16/2012   uterine prolapse.  Ovaries resected.   [facility].  Marland Kitchen BREAST LUMPECTOMY WITH RADIOACTIVE SEED AND AXILLARY LYMPH NODE DISSECTION Right   . COLONOSCOPY  04/17/2007   normal; repeat in 10 years.  [facility]  . COLPOPEXY  05/17/13    [facility]  . MASTECTOMY MODIFIED RADICAL Right 2008   s/p chemotherapy    Social History  Substance Use Topics  . Smoking status: Never Smoker  . Smokeless tobacco: Never Used  . Alcohol use No    Family History  Problem Relation Age of Onset  . Stroke Mother        TIAs in 4s.  . Hypertension Mother   . Heart disease Father 56       AMI age 24  . Hypertension Father   . Drug abuse Sister   . Cancer Sister        CLL  . Cervical cancer Sister   . Cancer Sister        Cervical cancer  . Breast cancer Neg Hx     Allergies  Allergen Reactions  . Eucalyptus Oil Anaphylaxis  . Other Shortness Of Breath    Euculyptus - causes shortness of breath  . Codeine Other (See Comments)    hallunication  . Eucalyptus Flavor [Flavoring Agent] Other (See Comments)    Causes difficulty in breathing    Medication list has been reviewed and updated.  Current Outpatient Prescriptions on File Prior to Visit  Medication Sig Dispense Refill  . aspirin 81 MG tablet Take 81 mg by mouth daily.    Marland Kitchen  Cholecalciferol (VITAMIN D-3 PO) Take 1,000 Units by mouth daily.    . fluticasone (FLONASE) 50 MCG/ACT nasal spray USE 2 SPRAYS INTO EACH NOSTRIL DAILY. 48 g 3  . hydrochlorothiazide (HYDRODIURIL) 25 MG tablet Take 1 tablet (25 mg total) by mouth daily. 90 tablet 3  . lisinopril (PRINIVIL,ZESTRIL) 10 MG tablet Take 1 tablet (10 mg total) by mouth daily. 90 tablet 3  . meclizine (ANTIVERT) 12.5 MG tablet Take 1 tablet (12.5 mg total) by mouth 3 (three) times daily as needed for dizziness or nausea. 30 tablet 0  . polyethylene glycol powder (MIRALAX) powder Take 17 g by mouth.     . simvastatin (ZOCOR) 10 MG tablet Take 1 tablet (10 mg total) by mouth every evening. 90 tablet 3  . SUMAtriptan (IMITREX) 100 MG tablet Take 0.5 tablets (50 mg total) by mouth every 2 (two) hours as needed for migraine. May repeat in 2 hours once. 8 tablet 5   No current facility-administered medications on file prior to  visit.     ROS ROS otherwise unremarkable unless listed above.  Physical Examination: BP (!) 142/82   Pulse 75   Temp 98.4 F (36.9 C) (Oral)   Resp 18   Ht 5' 10.47" (1.79 m)   Wt 182 lb (82.6 kg)   SpO2 98%   BMI 25.77 kg/m  Ideal Body Weight: Weight in (lb) to have BMI = 25: 176.2  Physical Exam  Constitutional: She is oriented to person, place, and time. She appears well-developed and well-nourished. No distress.  HENT:  Head: Normocephalic and atraumatic.  Right Ear: External ear normal.  Left Ear: External ear normal.  Right tm with mild effusion. Nostrils with significant pallor and mild edema  Eyes: Pupils are equal, round, and reactive to light. Conjunctivae and EOM are normal.  dix hall pike with dizzy symptoms incited with right side laying, without nystagmus detected. Notes some improvement following maneuver.    Cardiovascular: Normal rate and regular rhythm.   Pulses:      Radial pulses are 2+ on the right side, and 2+ on the left side.       Dorsalis pedis pulses are 2+ on the right side, and 2+ on the left side.  Pulmonary/Chest: Effort normal. No apnea. No respiratory distress. She has no decreased breath sounds. She has no wheezes. She has no rhonchi.  Neurological: She is alert and oriented to person, place, and time. No cranial nerve deficit. Coordination and gait normal.  Skin: She is not diaphoretic.  Psychiatric: She has a normal mood and affect. Her behavior is normal.     Assessment and Plan: [name] is a 66 y.o. female who is here today for cc of dizziness. Poss.  Vertigo secondary to bppv, etd. Advised increase in flonase to twice per day, meclizine increased with sedation precautions advised.  She was also advised to take zyrtec.  Will hold off on oral prednisone for possible etd, as this may affect her glucose, and she reports adverse effects.  This is very low likelihood to be diabetes.  She has maintained glucose at a low borderline  prediabetic rate.  She does not wish to have additional blood work at this time. She will perform epley maneuvering with husband. rtc as needed.  Benign paroxysmal positional vertigo due to bilateral vestibular disorder - Plan: meclizine (ANTIVERT) 25 MG tablet  Dysfunction of right eustachian tube - Plan: cetirizine (ZYRTEC) 10 MG tablet  Ivar Drape, PA-C [facility] 8/1/201812:02 PM

## 2016-11-14 NOTE — Patient Instructions (Addendum)
I would like you to increase your flonase to twice per day.  We will add zyrtec temporarily for the next 2 weeks to month.    How to Perform the Epley Maneuver The Epley maneuver is an exercise that relieves symptoms of vertigo. Vertigo is the feeling that you or your surroundings are moving when they are not. When you feel vertigo, you may feel like the room is spinning and have trouble walking. Dizziness is a little different than vertigo. When you are dizzy, you may feel unsteady or light-headed. You can do this maneuver at home whenever you have symptoms of vertigo. You can do it up to 3 times a day until your symptoms go away. Even though the Epley maneuver may relieve your vertigo for a few weeks, it is possible that your symptoms will return. This maneuver relieves vertigo, but it does not relieve dizziness. What are the risks? If it is done correctly, the Epley maneuver is considered safe. Sometimes it can lead to dizziness or nausea that goes away after a short time. If you develop other symptoms, such as changes in vision, weakness, or numbness, stop doing the maneuver and call your health care provider. How to perform the Epley maneuver 1. Sit on the edge of a bed or table with your back straight and your legs extended or hanging over the edge of the bed or table. 2. Turn your head halfway toward the affected ear or side. 3. Lie backward quickly with your head turned until you are lying flat on your back. You may want to position a pillow under your shoulders. 4. Hold this position for 30 seconds. You may experience an attack of vertigo. This is normal. 5. Turn your head to the opposite direction until your unaffected ear is facing the floor. 6. Hold this position for 30 seconds. You may experience an attack of vertigo. This is normal. Hold this position until the vertigo stops. 7. Turn your whole body to the same side as your head. Hold for another 30 seconds. 8. Sit back up. You can  repeat this exercise up to 3 times a day. Follow these instructions at home:  After doing the Epley maneuver, you can return to your normal activities.  Ask your health care provider if there is anything you should do at home to prevent vertigo. He or she may recommend that you: ? Keep your head raised (elevated) with two or more pillows while you sleep. ? Do not sleep on the side of your affected ear. ? Get up slowly from bed. ? Avoid sudden movements during the day. ? Avoid extreme head movement, like looking up or bending over. Contact a health care provider if:  Your vertigo gets worse.  You have other symptoms, including: ? Nausea. ? Vomiting. ? Headache. Get help right away if:  You have vision changes.  You have a severe or worsening headache or neck pain.  You cannot stop vomiting.  You have new numbness or weakness in any part of your body. Summary  Vertigo is the feeling that you or your surroundings are moving when they are not.  The Epley maneuver is an exercise that relieves symptoms of vertigo.  If the Epley maneuver is done correctly, it is considered safe. You can do it up to 3 times a day. This information is not intended to replace advice given to you by your health care provider. Make sure you discuss any questions you have with your health care provider.  Document Released: 04/07/2013 Document Revised: 02/21/2016 Document Reviewed: 02/21/2016 Elsevier Interactive Patient Education  2017 Elsevier Inc.  Benign Positional Vertigo Vertigo is the feeling that you or your surroundings are moving when they are not. Benign positional vertigo is the most common form of vertigo. The cause of this condition is not serious (is benign). This condition is triggered by certain movements and positions (is positional). This condition can be dangerous if it occurs while you are doing something that could endanger you or others, such as driving. What are the causes? In many  cases, the cause of this condition is not known. It may be caused by a disturbance in an area of the inner ear that helps your brain to sense movement and balance. This disturbance can be caused by a viral infection (labyrinthitis), head injury, or repetitive motion. What increases the risk? This condition is more likely to develop in:  Women.  People who are 59 years of age or older.  What are the signs or symptoms? Symptoms of this condition usually happen when you move your head or your eyes in different directions. Symptoms may start suddenly, and they usually last for less than a minute. Symptoms may include:  Loss of balance and falling.  Feeling like you are spinning or moving.  Feeling like your surroundings are spinning or moving.  Nausea and vomiting.  Blurred vision.  Dizziness.  Involuntary eye movement (nystagmus).  Symptoms can be mild and cause only slight annoyance, or they can be severe and interfere with daily life. Episodes of benign positional vertigo may return (recur) over time, and they may be triggered by certain movements. Symptoms may improve over time. How is this diagnosed? This condition is usually diagnosed by medical history and a physical exam of the head, neck, and ears. You may be referred to a health care provider who specializes in ear, nose, and throat (ENT) problems (otolaryngologist) or a provider who specializes in disorders of the nervous system (neurologist). You may have additional testing, including:  MRI.  A CT scan.  Eye movement tests. Your health care provider may ask you to change positions quickly while he or she watches you for symptoms of benign positional vertigo, such as nystagmus. Eye movement may be tested with an electronystagmogram (ENG), caloric stimulation, the Dix-Hallpike test, or the roll test.  An electroencephalogram (EEG). This records electrical activity in your brain.  Hearing tests.  How is this  treated? Usually, your health care provider will treat this by moving your head in specific positions to adjust your inner ear back to normal. Surgery may be needed in severe cases, but this is rare. In some cases, benign positional vertigo may resolve on its own in 2-4 weeks. Follow these instructions at home: Safety  Move slowly.Avoid sudden body or head movements.  Avoid driving.  Avoid operating heavy machinery.  Avoid doing any tasks that would be dangerous to you or others if a vertigo episode would occur.  If you have trouble walking or keeping your balance, try using a cane for stability. If you feel dizzy or unstable, sit down right away.  Return to your normal activities as told by your health care provider. Ask your health care provider what activities are safe for you. General instructions  Take over-the-counter and prescription medicines only as told by your health care provider.  Avoid certain positions or movements as told by your health care provider.  Drink enough fluid to keep your urine clear or pale yellow.  Keep all follow-up visits as told by your health care provider. This is important. Contact a health care provider if:  You have a fever.  Your condition gets worse or you develop new symptoms.  Your family or friends notice any behavioral changes.  Your nausea or vomiting gets worse.  You have numbness or a "pins and needles" sensation. Get help right away if:  You have difficulty speaking or moving.  You are always dizzy.  You faint.  You develop severe headaches.  You have weakness in your legs or arms.  You have changes in your hearing or vision.  You develop a stiff neck.  You develop sensitivity to light. This information is not intended to replace advice given to you by your health care provider. Make sure you discuss any questions you have with your health care provider. Document Released: 01/08/2006 Document Revised: 09/08/2015  Document Reviewed: 07/26/2014 Elsevier Interactive Patient Education  2018 [employer].   IF you received an x-ray today, you will receive an invoice from [facility]. Please contact [facility] at [phone] with questions or concerns regarding your invoice.   IF you received labwork today, you will receive an invoice from [facility]. Please contact [facility] at [phone] with questions or concerns regarding your invoice.   Our billing staff will not be able to assist you with questions regarding bills from these companies.  You will be contacted with the lab results as soon as they are available. The fastest way to get your results is to activate your My Chart account. Instructions are located on the last page of this paperwork. If you have not heard from [place] regarding the results in 2 weeks, please contact this office.     Eustachian tube

## 2016-11-21 ENCOUNTER — Ambulatory Visit: Payer: BC Managed Care – PPO | Admitting: Family Medicine

## 2016-12-24 ENCOUNTER — Telehealth: Payer: Self-pay

## 2016-12-24 NOTE — Telephone Encounter (Signed)
Left vm for pt to only come in at 9:20 for pcp visit. Pt needs to do AWV with PCP this year instead of nurse health advisor because it's her first one welcome to medicare visit. -nr   [name], B.A.  Care Guide [phone]

## 2016-12-25 ENCOUNTER — Ambulatory Visit: Payer: Medicare Other

## 2016-12-25 ENCOUNTER — Encounter: Payer: Self-pay | Admitting: Family Medicine

## 2016-12-25 ENCOUNTER — Ambulatory Visit (INDEPENDENT_AMBULATORY_CARE_PROVIDER_SITE_OTHER): Payer: Medicare Other | Admitting: Family Medicine

## 2016-12-25 VITALS — BP 136/82 | HR 71 | Temp 97.8°F | Resp 16 | Ht 70.87 in | Wt 178.0 lb

## 2016-12-25 DIAGNOSIS — I10 Essential (primary) hypertension: Secondary | ICD-10-CM

## 2016-12-25 DIAGNOSIS — Z23 Encounter for immunization: Secondary | ICD-10-CM

## 2016-12-25 DIAGNOSIS — Z170 Estrogen receptor positive status [ER+]: Secondary | ICD-10-CM | POA: Diagnosis not present

## 2016-12-25 DIAGNOSIS — E7800 Pure hypercholesterolemia, unspecified: Secondary | ICD-10-CM | POA: Diagnosis not present

## 2016-12-25 DIAGNOSIS — K5901 Slow transit constipation: Secondary | ICD-10-CM

## 2016-12-25 DIAGNOSIS — C50411 Malignant neoplasm of upper-outer quadrant of right female breast: Secondary | ICD-10-CM | POA: Diagnosis not present

## 2016-12-25 DIAGNOSIS — R7302 Impaired glucose tolerance (oral): Secondary | ICD-10-CM

## 2016-12-25 DIAGNOSIS — J301 Allergic rhinitis due to pollen: Secondary | ICD-10-CM | POA: Diagnosis not present

## 2016-12-25 MED ORDER — ZOSTER VAC RECOMB ADJUVANTED 50 MCG/0.5ML IM SUSR: 0.5000 mL | Freq: Once | INTRAMUSCULAR | 1 refills | Status: AC

## 2016-12-25 NOTE — Patient Instructions (Addendum)
  Recommend the following:   [place]   IF you received an x-ray today, you will receive an invoice from [facility]. Please contact [facility] at [phone] with questions or concerns regarding your invoice.   IF you received labwork today, you will receive an invoice from [facility]. Please contact [facility] at [phone] with questions or concerns regarding your invoice.   Our billing staff will not be able to assist you with questions regarding bills from these companies.  You will be contacted with the lab results as soon as they are available. The fastest way to get your results is to activate your My Chart account. Instructions are located on the last page of this paperwork. If you have not heard from [place] regarding the results in 2 weeks, please contact this office.

## 2016-12-25 NOTE — Progress Notes (Signed)
Subjective:    Patient ID: [name], female    DOB: 1950-11-09, 66 y.o.   MRN: [id number]  12/25/2016  Hypertension (6 month follow-up) and Hyperlipidemia   HPI This 66 y.o. female presents for six month follow-up of hypertension, hypercholesterolemia, glucose intolerance, allergic rhinitis, history of breast cancer.  No changes to management made at last visit. Labs normal other than elevated cholesterol.   Presented on 11/14/16 due to recurrent dizziness; evaluated by PA English; prescribed Zyrtec and Meclizine.  Got dizzy while singing in choir. Duration not sure; one week duration.   did not tolerate Zyrtec at all; felt drugged; drowsy.  Benadryl very effective.  Performed exercises Eply maneuvers. Home BPs running 130/77-108/69 pulse 70-82  COLONOSCOPY 2009.  Migraines have greatly improved; onset in childhood.  Have greatly improved recently.   BP Readings from Last 3 Encounters:  12/25/16 136/82  11/14/16 (!) 142/82  06/19/16 137/84   Wt Readings from Last 3 Encounters:  12/25/16 178 lb (80.7 kg)  11/14/16 182 lb (82.6 kg)  06/19/16 177 lb (80.3 kg)   Immunization History  Administered Date(s) Administered  . Influenza,inj,Quad PF,6+ Mos 02/24/2013, 03/10/2014, 01/20/2015, 12/20/2015, 12/25/2016  . Influenza-Unspecified 02/08/2012  . Tdap 10/20/2008  . Zoster 02/20/2012    Review of Systems  Constitutional: Negative for chills, diaphoresis, fatigue and fever.  Eyes: Negative for visual disturbance.  Respiratory: Negative for cough and shortness of breath.   Cardiovascular: Negative for chest pain, palpitations and leg swelling.  Gastrointestinal: Negative for abdominal pain, constipation, diarrhea, nausea and vomiting.  Endocrine: Negative for cold intolerance, heat intolerance, polydipsia, polyphagia and polyuria.  Neurological: Negative for dizziness, tremors, seizures, syncope, facial asymmetry, speech difficulty, weakness, light-headedness, numbness and  headaches.    Past Medical History:  Diagnosis Date  . Allergy    seasonal  . Breast cancer ([place])    right mastectomy-2008  . Cancer ([place])    Breast Stage IIx2000  . Hyperlipidemia   . Hypertension   . Migraines   . Personal history of chemotherapy    Past Surgical History:  Procedure Laterality Date  . ABDOMINAL HYSTERECTOMY  03/16/2012   uterine prolapse.  Ovaries resected.   [facility].  Marland Kitchen BREAST LUMPECTOMY WITH RADIOACTIVE SEED AND AXILLARY LYMPH NODE DISSECTION Right   . COLONOSCOPY  04/17/2007   normal; repeat in 10 years.  [facility]  . COLPOPEXY  05/17/13   [facility]  . MASTECTOMY MODIFIED RADICAL Right 2008   s/p chemotherapy   Allergies  Allergen Reactions  . Eucalyptus Oil Anaphylaxis  . Other Shortness Of Breath    Euculyptus - causes shortness of breath  . Codeine Other (See Comments)    hallunication  . Eucalyptus Flavor [Flavoring Agent] Other (See Comments)    Causes difficulty in breathing    Social History   Social History  . Marital status: Married    Spouse name: N/A  . Number of children: 1  . Years of education: N/A   Occupational History  . 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 Smoking status: Never Smoker  . Smokeless tobacco: Never Used  . Alcohol use No  . Drug use: No  . Sexual activity: Yes    Birth control/ protection: Post-menopausal, Surgical   Other Topics Concern  . Not on file   Social History Narrative   Marital status:  Married x 36 years;  happily married; no abuse.      Children: one daughter (72) [place]; no grandchildren.      Lives: with husband in [place].      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  Working at [employer] 20 hours per week.      Tobacco:  Never.      Alcohol: none      Drugs: none      Exercise:  Job very physical; walking during the week.  Walking minimally in  2018.      Seatbelt:  100%; no texting while driving.      Guns: loaded; unsecured.      Sunscreen:  None; rare sun exposure.   Family History  Problem Relation Age of Onset  . Stroke Mother        TIAs in 24s.  . Hypertension Mother   . Heart disease Father 58       AMI age 48  . Hypertension Father   . Drug abuse Sister   . Cancer Sister        CLL  . Cervical cancer Sister   . Cancer Sister        Cervical cancer  . Breast cancer Neg Hx        Objective:    BP 136/82   Pulse 71   Temp 97.8 F (36.6 C) (Oral)   Resp 16   Ht 5' 10.87" (1.8 m)   Wt 178 lb (80.7 kg)   SpO2 99%   BMI 24.92 kg/m  Physical Exam  Constitutional: She is oriented to person, place, and time. She appears well-developed and well-nourished. No distress.  HENT:  Head: Normocephalic and atraumatic.  Right Ear: External ear normal.  Left Ear: External ear normal.  Nose: Nose normal.  Mouth/Throat: Oropharynx is clear and moist.  Eyes: Pupils are equal, round, and reactive to light. Conjunctivae and EOM are normal.  Neck: Normal range of motion. Neck supple. Carotid bruit is not present. No thyromegaly present.  Cardiovascular: Normal rate, regular rhythm, normal heart sounds and intact distal pulses.  Exam reveals no gallop and no friction rub.   No murmur heard. Pulmonary/Chest: Effort normal and breath sounds normal. She has no wheezes. She has no rales.  Abdominal: Soft. Bowel sounds are normal. She exhibits no distension and no mass. There is no tenderness. There is no rebound and no guarding.  Lymphadenopathy:    She has no cervical adenopathy.  Neurological: She is alert and oriented to person, place, and time. No cranial nerve deficit.  Skin: Skin is warm and dry. No rash noted. She is not diaphoretic. No erythema. No pallor.  Psychiatric: She has a normal mood and affect. Her behavior is normal.    No results found. Depression screen [facility] 2/9 12/25/2016 11/14/2016 06/19/2016 05/01/2016  01/12/2016  Decreased Interest 0 0 0 0 0  Down, Depressed, Hopeless 0 0 0 0 0  PHQ - 2 Score 0 0 0 0 0   Fall Risk  12/25/2016 11/14/2016 06/19/2016 05/01/2016 12/20/2015  Falls in the past year? No No No No No        Assessment & Plan:   1. Essential hypertension, benign   2. Glucose intolerance (impaired glucose tolerance)   3. Pure hypercholesterolemia   4. Seasonal allergic rhinitis due to pollen   5. Malignant neoplasm of upper-outer quadrant of right breast in female, estrogen receptor positive ([place])   6. Need for prophylactic vaccination and inoculation  against influenza   7. Need for shingles vaccine   8. Slow transit constipation    -controlled chronic medical conditions; obtain labs for chronic disease management; continue current treatment plan. -recommend weight loss, exercise for 30-60 minutes five days per week; recommend 1200 kcal restriction per day with a minimum of 60 grams of protein per day. -recommend Metamucil, Citrucel, Fibercon for constipation.   Orders Placed This Encounter  Procedures  . Flu Vaccine QUAD 36+ mos IM  . CBC with Differential/Platelet  . Comprehensive metabolic panel    Order Specific Question:   Has the patient fasted?    Answer:   Yes  . Hemoglobin A1c  . Lipid panel    Order Specific Question:   Has the patient fasted?    Answer:   Yes  . Care order/instruction:    Please recheck BP.   Meds ordered this encounter  Medications  . Zoster Vac Recomb Adjuvanted [facility]) injection    Sig: Inject 0.5 mLs into the muscle once.    Dispense:  0.5 mL    Refill:  1    Return in about 6 months (around 06/24/2017) for complete physical examiniation/Welcome to Medicare.   Audryna Wendt Elayne Guerin, M.D. Primary Care at [facility] [address] [place], [place]  [phone] phone [phone] fax

## 2016-12-26 LAB — CBC WITH DIFFERENTIAL/PLATELET
BASOS: 2 %
Basophils Absolute: 0.1 10*3/uL (ref 0.0–0.2)
EOS (ABSOLUTE): 0.2 10*3/uL (ref 0.0–0.4)
EOS: 5 %
HEMATOCRIT: 39.5 % (ref 34.0–46.6)
HEMOGLOBIN: 13.2 g/dL (ref 11.1–15.9)
Immature Grans (Abs): 0 10*3/uL (ref 0.0–0.1)
Immature Granulocytes: 0 %
LYMPHS ABS: 1.3 10*3/uL (ref 0.7–3.1)
Lymphs: 33 %
MCH: 30.7 pg (ref 26.6–33.0)
MCHC: 33.4 g/dL (ref 31.5–35.7)
MCV: 92 fL (ref 79–97)
MONOCYTES: 12 %
Monocytes Absolute: 0.5 10*3/uL (ref 0.1–0.9)
NEUTROS ABS: 1.8 10*3/uL (ref 1.4–7.0)
Neutrophils: 48 %
Platelets: 400 10*3/uL — ABNORMAL HIGH (ref 150–379)
RBC: 4.3 x10E6/uL (ref 3.77–5.28)
RDW: 13.3 % (ref 12.3–15.4)
WBC: 3.8 10*3/uL (ref 3.4–10.8)

## 2016-12-26 LAB — LIPID PANEL
CHOL/HDL RATIO: 2.7 ratio (ref 0.0–4.4)
CHOLESTEROL TOTAL: 185 mg/dL (ref 100–199)
HDL: 68 mg/dL (ref >39)
LDL CALC: 103 mg/dL — AB (ref 0–99)
Triglycerides: 68 mg/dL (ref 0–149)
VLDL CHOLESTEROL CAL: 14 mg/dL (ref 5–40)

## 2016-12-26 LAB — COMPREHENSIVE METABOLIC PANEL
A/G RATIO: 1.7 (ref 1.2–2.2)
ALBUMIN: 5.1 g/dL — AB (ref 3.6–4.8)
ALT: 11 IU/L (ref 0–32)
AST: 21 IU/L (ref 0–40)
Alkaline Phosphatase: 62 IU/L (ref 39–117)
BUN / CREAT RATIO: 16 (ref 12–28)
BUN: 12 mg/dL (ref 8–27)
Bilirubin Total: 0.6 mg/dL (ref 0.0–1.2)
CALCIUM: 10.3 mg/dL (ref 8.7–10.3)
CO2: 25 mmol/L (ref 20–29)
Chloride: 94 mmol/L — ABNORMAL LOW (ref 96–106)
Creatinine, Ser: 0.76 mg/dL (ref 0.57–1.00)
GFR, EST AFRICAN AMERICAN: 95 mL/min/{1.73_m2} (ref >59)
GFR, EST NON AFRICAN AMERICAN: 83 mL/min/{1.73_m2} (ref >59)
GLOBULIN, TOTAL: 3 g/dL (ref 1.5–4.5)
Glucose: 93 mg/dL (ref 65–99)
POTASSIUM: 4.3 mmol/L (ref 3.5–5.2)
Sodium: 137 mmol/L (ref 134–144)
TOTAL PROTEIN: 8.1 g/dL (ref 6.0–8.5)

## 2016-12-26 LAB — HEMOGLOBIN A1C
Est. average glucose Bld gHb Est-mCnc: 123 mg/dL
Hgb A1c MFr Bld: 5.9 % — ABNORMAL HIGH (ref 4.8–5.6)

## 2017-01-11 ENCOUNTER — Other Ambulatory Visit: Payer: BC Managed Care – PPO

## 2017-01-11 ENCOUNTER — Ambulatory Visit: Payer: BC Managed Care – PPO | Admitting: Oncology

## 2017-01-14 NOTE — Progress Notes (Signed)
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  INTERVAL HISTORY: Patient returns to clinic today for routine yearly follow-up. She continues to feel well and is asymptomatic. She has no neurologic complaints. She denies any recent fevers or illnesses. She has a good appetite and denies weight loss. She denies any bony pain. She has no chest pain or shortness of breath. She denies any nausea, vomiting, constipation, or diarrhea. She has no urinary complaints. Patient feels at her baseline and offers no specific complaints today.  REVIEW OF SYSTEMS:   Review of Systems  Constitutional: Negative.  Negative for fever, malaise/fatigue and weight loss.  Respiratory: Negative.  Negative for cough and shortness of breath.   Cardiovascular: Negative.  Negative for chest pain and leg swelling.  Gastrointestinal: Negative.  Negative for abdominal pain.  Genitourinary: Negative.   Musculoskeletal: Negative.   Skin: Negative.  Negative for rash.  Neurological: Negative.  Negative for sensory change and weakness.  Psychiatric/Behavioral: Negative.  The patient is not nervous/anxious.     As per HPI. Otherwise, a complete review of systems is negative.  PAST MEDICAL HISTORY: Past Medical History:  Diagnosis Date  . Allergy    seasonal  . Breast cancer ([place])    right mastectomy-2008  . Cancer ([place])    Breast Stage IIx2000  . Hyperlipidemia   . Hypertension   . Migraines   . Personal history of chemotherapy     PAST SURGICAL HISTORY: Past Surgical History:  Procedure Laterality Date  . ABDOMINAL HYSTERECTOMY  03/16/2012   uterine prolapse.  Ovaries resected.   [facility].  [facility] BREAST LUMPECTOMY  WITH RADIOACTIVE SEED AND AXILLARY LYMPH NODE DISSECTION Right   . COLONOSCOPY  04/17/2007   normal; repeat in 10 years.  [facility].  . COLPOPEXY  05/17/13   [facility]  . MASTECTOMY MODIFIED RADICAL Right 2008   s/p chemotherapy    FAMILY HISTORY: Family History  Problem Relation Age of Onset  . Stroke Mother        TIAs in 65s.  . Hypertension Mother   . Heart disease Father 4       AMI age 65  . Hypertension Father   . Drug abuse Sister   . Cancer Sister        CLL  . Cervical cancer Sister   . Cancer Sister        Cervical cancer  . Breast cancer Neg Hx     ADVANCED DIRECTIVES (Y/N):  N  HEALTH MAINTENANCE: Social History  Substance Use Topics  . Smoking status: Never Smoker  . Smokeless tobacco: Never Used  . Alcohol use No     Colonoscopy:  PAP:  Bone density:  Lipid panel:  Allergies  Allergen Reactions  . Eucalyptus Oil Anaphylaxis  . Other Shortness Of Breath    Euculyptus - causes shortness of breath  . Codeine Other (See Comments)    hallunication  . Eucalyptus Flavor [Flavoring Agent] Other (See Comments)    Causes difficulty in breathing    Current Outpatient Prescriptions  Medication Sig Dispense Refill  . aspirin 81 MG tablet Take 81 mg by mouth daily.    . cetirizine (ZYRTEC) 10 MG tablet Take 10 mg by mouth daily.  1  .  Cholecalciferol (VITAMIN D-3 PO) Take 1,000 Units by mouth daily.    . fluticasone (FLONASE) 50 MCG/ACT nasal spray USE 2 SPRAYS INTO EACH NOSTRIL DAILY. 48 g 3  . hydrochlorothiazide (HYDRODIURIL) 25 MG tablet Take 1 tablet (25 mg total) by mouth daily. 90 tablet 3  . lisinopril (PRINIVIL,ZESTRIL) 10 MG tablet Take 1 tablet (10 mg total) by mouth daily. 90 tablet 3  . meclizine (ANTIVERT) 25 MG tablet Take 1 tablet (25 mg total) by mouth 3 (three) times daily as needed for dizziness. 30 tablet 0  . polyethylene glycol powder (MIRALAX) powder Take 17 g by mouth.     . simvastatin (ZOCOR) 10 MG tablet Take 1 tablet (10 mg  total) by mouth every evening. 90 tablet 3  . SUMAtriptan (IMITREX) 100 MG tablet Take 0.5 tablets (50 mg total) by mouth every 2 (two) hours as needed for migraine. May repeat in 2 hours once. 8 tablet 5   No current facility-administered medications for this visit.     OBJECTIVE: Vitals:   01/18/17 0912  BP: 124/79  Pulse: 71  Resp: 18  Temp: (!) 96.8 F (36 C)     Body mass index is 25.09 kg/m.    ECOG FS:0 - Asymptomatic  General: Well-developed, well-nourished, no acute distress. Eyes: Pink conjunctiva, anicteric sclera. HEENT: Normocephalic, moist mucous membranes, clear oropharnyx. Breasts: Right mastectomy. Lungs: Clear to auscultation bilaterally. Heart: Regular rate and rhythm. No rubs, murmurs, or gallops. Abdomen: Soft, nontender, nondistended. No organomegaly noted, normoactive bowel sounds. Musculoskeletal: No edema, cyanosis, or clubbing. Neuro: Alert, answering all questions appropriately. Cranial nerves grossly intact. Skin: No rashes or petechiae noted. Psych: Normal affect.   LAB RESULTS:  Lab Results  Component Value Date   NA 137 12/25/2016   K 4.3 12/25/2016   CL 94 (L) 12/25/2016   CO2 25 12/25/2016   GLUCOSE 93 12/25/2016   BUN 12 12/25/2016   CREATININE 0.76 12/25/2016   CALCIUM 10.3 12/25/2016   PROT 8.1 12/25/2016   ALBUMIN 5.1 (H) 12/25/2016   AST 21 12/25/2016   ALT 11 12/25/2016   ALKPHOS 62 12/25/2016   BILITOT 0.6 12/25/2016   GFRNONAA 83 12/25/2016   GFRAA 95 12/25/2016    Lab Results  Component Value Date   WBC 3.8 12/25/2016   NEUTROABS 1.8 12/25/2016   HGB 13.2 12/25/2016   HCT 39.5 12/25/2016   MCV 92 12/25/2016   PLT 400 (H) 12/25/2016   Lab Results  Component Value Date   LABCA2 17.8 01/13/2016     STUDIES: No results found.  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  PLAN:    1. Pathologic stage IIb triple positive adenocarcinoma of  the upper outer quadrant of the right breast: Patient Initially underwent lumpectomy, but then by report had positive margins therefore proceeded with complete mastectomy in November 2008. She subsequently completed adjuvant Adriamycin and Cytoxan as well as 12 weeks of Taxol in May 2009. She completed her yearly Herceptin maintenance in February 2010. She completed nearly 7 years of letrozole in approximately May 2016. Her most recent right breast screening mammogram on Sep 06, 2016 was reported as BI-RADS 1. Repeat in May 2019. After lengthy discussion with the patient, it was agreed upon that no further follow-up is necessary. Her primary care physician can now order her mammograms and mastectomy supplies. Please refer patient back if there are any questions or concerns. 2. Thrombocytopenia: Patient's platelet count is  mildly elevated, but essentially unchanged. No intervention is needed.  Patient expressed understanding and was in agreement with this plan. She also understands that She can call clinic at any time with any questions, concerns, or complaints.   Lloyd Huger, MD   01/18/2017 9:16 AM

## 2017-01-18 ENCOUNTER — Inpatient Hospital Stay (HOSPITAL_BASED_OUTPATIENT_CLINIC_OR_DEPARTMENT_OTHER): Payer: Medicare Other | Admitting: Oncology

## 2017-01-18 ENCOUNTER — Inpatient Hospital Stay: Payer: Medicare Other | Attending: Oncology

## 2017-01-18 ENCOUNTER — Other Ambulatory Visit: Payer: Self-pay

## 2017-01-18 ENCOUNTER — Ambulatory Visit: Payer: BC Managed Care – PPO | Admitting: Oncology

## 2017-01-18 VITALS — BP 124/79 | HR 71 | Temp 96.8°F | Resp 18 | Wt 179.2 lb

## 2017-01-18 DIAGNOSIS — Z7982 Long term (current) use of aspirin: Secondary | ICD-10-CM | POA: Insufficient documentation

## 2017-01-18 DIAGNOSIS — Z9221 Personal history of antineoplastic chemotherapy: Secondary | ICD-10-CM | POA: Insufficient documentation

## 2017-01-18 DIAGNOSIS — E785 Hyperlipidemia, unspecified: Secondary | ICD-10-CM | POA: Diagnosis not present

## 2017-01-18 DIAGNOSIS — Z79899 Other long term (current) drug therapy: Secondary | ICD-10-CM | POA: Diagnosis not present

## 2017-01-18 DIAGNOSIS — C50411 Malignant neoplasm of upper-outer quadrant of right female breast: Secondary | ICD-10-CM

## 2017-01-18 DIAGNOSIS — Z853 Personal history of malignant neoplasm of breast: Secondary | ICD-10-CM

## 2017-01-18 DIAGNOSIS — Z806 Family history of leukemia: Secondary | ICD-10-CM | POA: Insufficient documentation

## 2017-01-18 DIAGNOSIS — Z8049 Family history of malignant neoplasm of other genital organs: Secondary | ICD-10-CM | POA: Insufficient documentation

## 2017-01-18 DIAGNOSIS — I10 Essential (primary) hypertension: Secondary | ICD-10-CM | POA: Diagnosis not present

## 2017-01-18 DIAGNOSIS — D696 Thrombocytopenia, unspecified: Secondary | ICD-10-CM | POA: Diagnosis not present

## 2017-01-18 DIAGNOSIS — Z170 Estrogen receptor positive status [ER+]: Principal | ICD-10-CM

## 2017-01-18 DIAGNOSIS — Z9011 Acquired absence of right breast and nipple: Secondary | ICD-10-CM | POA: Diagnosis not present

## 2017-01-18 DIAGNOSIS — C50911 Malignant neoplasm of unspecified site of right female breast: Secondary | ICD-10-CM

## 2017-01-18 NOTE — Progress Notes (Signed)
Patient denies any concerns today.  

## 2017-01-19 LAB — CANCER ANTIGEN 27.29: CA 27.29: 20.1 U/mL (ref 0.0–38.6)

## 2017-06-18 ENCOUNTER — Other Ambulatory Visit: Payer: Self-pay | Admitting: Family Medicine

## 2017-06-24 ENCOUNTER — Encounter: Payer: Self-pay | Admitting: Family Medicine

## 2017-06-24 ENCOUNTER — Ambulatory Visit (INDEPENDENT_AMBULATORY_CARE_PROVIDER_SITE_OTHER): Payer: Medicare Other | Admitting: Family Medicine

## 2017-06-24 ENCOUNTER — Other Ambulatory Visit: Payer: Self-pay

## 2017-06-24 VITALS — BP 138/82 | HR 88 | Temp 98.4°F | Resp 16 | Ht 70.67 in | Wt 176.8 lb

## 2017-06-24 DIAGNOSIS — Z23 Encounter for immunization: Secondary | ICD-10-CM

## 2017-06-24 DIAGNOSIS — I10 Essential (primary) hypertension: Secondary | ICD-10-CM | POA: Diagnosis not present

## 2017-06-24 DIAGNOSIS — E2839 Other primary ovarian failure: Secondary | ICD-10-CM | POA: Diagnosis not present

## 2017-06-24 DIAGNOSIS — J301 Allergic rhinitis due to pollen: Secondary | ICD-10-CM

## 2017-06-24 DIAGNOSIS — Z170 Estrogen receptor positive status [ER+]: Secondary | ICD-10-CM | POA: Diagnosis not present

## 2017-06-24 DIAGNOSIS — R739 Hyperglycemia, unspecified: Secondary | ICD-10-CM | POA: Diagnosis not present

## 2017-06-24 DIAGNOSIS — Z0000 Encounter for general adult medical examination without abnormal findings: Secondary | ICD-10-CM | POA: Diagnosis not present

## 2017-06-24 DIAGNOSIS — C50411 Malignant neoplasm of upper-outer quadrant of right female breast: Secondary | ICD-10-CM

## 2017-06-24 DIAGNOSIS — E7800 Pure hypercholesterolemia, unspecified: Secondary | ICD-10-CM

## 2017-06-24 LAB — POCT URINALYSIS DIP (MANUAL ENTRY)
Bilirubin, UA: NEGATIVE
Blood, UA: NEGATIVE
Glucose, UA: NEGATIVE mg/dL
Ketones, POC UA: NEGATIVE mg/dL
NITRITE UA: NEGATIVE
PH UA: 7 (ref 5.0–8.0)
PROTEIN UA: NEGATIVE mg/dL
Spec Grav, UA: 1.01 (ref 1.010–1.025)
UROBILINOGEN UA: 0.2 U/dL

## 2017-06-24 MED ORDER — LISINOPRIL 10 MG PO TABS: 10.0000 mg | ORAL_TABLET | Freq: Every day | ORAL | 3 refills | Status: AC

## 2017-06-24 MED ORDER — SIMVASTATIN 10 MG PO TABS: 10.0000 mg | ORAL_TABLET | Freq: Every evening | ORAL | 3 refills | Status: AC

## 2017-06-24 MED ORDER — HYDROCHLOROTHIAZIDE 25 MG PO TABS: 25.0000 mg | ORAL_TABLET | Freq: Every day | ORAL | 3 refills | Status: AC

## 2017-06-24 NOTE — Patient Instructions (Addendum)
   IF you received an x-ray today, you will receive an invoice from [facility]. Please contact  Radiology at [phone] with questions or concerns regarding your invoice.   IF you received labwork today, you will receive an invoice from [facility]. Please contact [facility] at [phone] with questions or concerns regarding your invoice.   Our billing staff will not be able to assist you with questions regarding bills from these companies.  You will be contacted with the lab results as soon as they are available. The fastest way to get your results is to activate your My Chart account. Instructions are located on the last page of this paperwork. If you have not heard from us regarding the results in 2 weeks, please contact this office.      Preventive Care 65 Years and Older, Female Preventive care refers to lifestyle choices and visits with your health care provider that can promote health and wellness. What does preventive care include?  A yearly physical exam. This is also called an annual well check.  Dental exams once or twice a year.  Routine eye exams. Ask your health care provider how often you should have your eyes checked.  Personal lifestyle choices, including: ? Daily care of your teeth and gums. ? Regular physical activity. ? Eating a healthy diet. ? Avoiding tobacco and drug use. ? Limiting alcohol use. ? Practicing safe sex. ? Taking low-dose aspirin every day. ? Taking vitamin and mineral supplements as recommended by your health care provider. What happens during an annual well check? The services and screenings done by your health care provider during your annual well check will depend on your age, overall health, lifestyle risk factors, and family history of disease. Counseling Your health care provider may ask you questions about your:  Alcohol use.  Tobacco use.  Drug use.  Emotional well-being.  Home and relationship  well-being.  Sexual activity.  Eating habits.  History of falls.  Memory and ability to understand (cognition).  Work and work environment.  Reproductive health.  Screening You may have the following tests or measurements:  Height, weight, and BMI.  Blood pressure.  Lipid and cholesterol levels. These may be checked every 5 years, or more frequently if you are over 50 years old.  Skin check.  Lung cancer screening. You may have this screening every year starting at age 55 if you have a 30-pack-year history of smoking and currently smoke or have quit within the past 15 years.  Fecal occult blood test (FOBT) of the stool. You may have this test every year starting at age 50.  Flexible sigmoidoscopy or colonoscopy. You may have a sigmoidoscopy every 5 years or a colonoscopy every 10 years starting at age 50.  Hepatitis C blood test.  Hepatitis B blood test.  Sexually transmitted disease (STD) testing.  Diabetes screening. This is done by checking your blood sugar (glucose) after you have not eaten for a while (fasting). You may have this done every 1-3 years.  Bone density scan. This is done to screen for osteoporosis. You may have this done starting at age 65.  Mammogram. This may be done every 1-2 years. Talk to your health care provider about how often you should have regular mammograms.  Talk with your health care provider about your test results, treatment options, and if necessary, the need for more tests. Vaccines Your health care provider may recommend certain vaccines, such as:  Influenza vaccine. This is recommended every year.    Tetanus, diphtheria, and acellular pertussis (Tdap, Td) vaccine. You may need a Td booster every 10 years.  Varicella vaccine. You may need this if you have not been vaccinated.  Zoster vaccine. You may need this after age 60.  Measles, mumps, and rubella (MMR) vaccine. You may need at least one dose of MMR if you were born in  1956 or later. You may also need a second dose.  Pneumococcal 13-valent conjugate (PCV13) vaccine. One dose is recommended after age 65.  Pneumococcal polysaccharide (PPSV23) vaccine. One dose is recommended after age 65.  Meningococcal vaccine. You may need this if you have certain conditions.  Hepatitis A vaccine. You may need this if you have certain conditions or if you travel or work in places where you may be exposed to hepatitis A.  Hepatitis B vaccine. You may need this if you have certain conditions or if you travel or work in places where you may be exposed to hepatitis B.  Haemophilus influenzae type b (Hib) vaccine. You may need this if you have certain conditions.  Talk to your health care provider about which screenings and vaccines you need and how often you need them. This information is not intended to replace advice given to you by your health care provider. Make sure you discuss any questions you have with your health care provider. Document Released: 04/29/2015 Document Revised: 12/21/2015 Document Reviewed: 02/01/2015 Elsevier Interactive Patient Education  2018 [employer]  

## 2017-06-24 NOTE — Progress Notes (Signed)
Subjective:    Patient ID: [name], female    DOB: Rozell 10, 1952, 67 y.o.   MRN: [id number]  06/24/2017  Annual Exam    HPI This 67 y.o. female presents for WELCOME TO MEDICARE PHYSICAL and follow-up of chronic medical conditions.  No changes to management made at last visit; labs normal at last visit.  Last physical:  06-2016 Pap smear:  hysterectomy Mammogram:  08-2016 Colonoscopy:  2009; no letter. Bone density:  2014 Eye exam:  Every year. Dental exam:  Every six months.   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  Constipation: taking Miralax.     HTN: Patient reports good compliance with medication, good tolerance to medication, and good symptom control.    Hypercholesterolemia: Patient reports good compliance with medication, good tolerance to medication, and good symptom control.       Visual Acuity Screening   Right eye Left eye Both eyes  Without correction:     With correction: 20/20-1 20/20-1 20/20    BP Readings from Last 3 Encounters:  06/24/17 138/82  01/18/17 124/79  12/25/16 136/82   Wt Readings from Last 3 Encounters:  06/24/17 176 lb 12.8 oz (80.2 kg)  01/18/17 179 lb 3.7 oz (81.3 kg)  12/25/16 178 lb (80.7 kg)   Immunization History  Administered Date(s) Administered  . Influenza,inj,Quad PF,6+ Mos 02/24/2013, 03/10/2014, 01/20/2015, 12/20/2015, 12/25/2016  . Influenza-Unspecified 02/08/2012  . Tdap 10/20/2008  . Zoster 02/20/2012   Health Maintenance  Topic Date Due  . PNA vac Low Risk Adult (1 of 2 - PCV13) 10/04/2016  . PAP SMEAR  06/24/2017 (Originally 02/08/2015)  . COLONOSCOPY  02/14/2018  . MAMMOGRAM  09/07/2018  . TETANUS/TDAP  10/21/2018  . INFLUENZA VACCINE  Completed  . DEXA SCAN  Completed  . Hepatitis C Screening  Completed  . HIV Screening  Completed   Review of Systems  Constitutional: Negative for activity change, appetite change, chills, diaphoresis, fatigue, fever and unexpected weight change.  HENT:  Negative for congestion, dental problem, drooling, ear discharge, ear pain, facial swelling, hearing loss, mouth sores, nosebleeds, postnasal drip, rhinorrhea, sinus pressure, sneezing, sore throat, tinnitus, trouble swallowing and voice change.   Eyes: Negative for photophobia, pain, discharge, redness, itching and visual disturbance.  Respiratory: Negative for apnea, cough, choking, chest tightness, shortness of breath, wheezing and stridor.   Cardiovascular: Negative for chest pain, palpitations and leg swelling.  Gastrointestinal: Negative for abdominal distention, abdominal pain, anal bleeding, blood in stool, constipation, diarrhea, nausea, rectal pain and vomiting.  Endocrine: Negative for cold intolerance, heat intolerance, polydipsia, polyphagia and polyuria.  Genitourinary: Negative for decreased urine volume, difficulty urinating, dyspareunia, dysuria, enuresis, flank pain, frequency, genital sores, hematuria, menstrual problem, pelvic pain, urgency, vaginal bleeding, vaginal discharge and vaginal pain.       Nocturia x 2.  Urinary leakage NONE.  Musculoskeletal: Negative for arthralgias, back pain, gait problem, joint swelling, myalgias, neck pain and neck stiffness.  Skin: Negative for color change, pallor, rash and wound.  Allergic/Immunologic: Negative for environmental allergies, food allergies and immunocompromised state.  Neurological: Negative for dizziness, tremors, seizures, syncope, facial asymmetry, speech difficulty, weakness, light-headedness, numbness and headaches.  Hematological: Negative for adenopathy. Does not bruise/bleed easily.  Psychiatric/Behavioral: Negative for agitation, behavioral problems, confusion, decreased concentration, dysphoric mood, hallucinations, self-injury, sleep disturbance and suicidal ideas. The patient is not nervous/anxious and is not hyperactive.        Bedtime [phone]; wakes up 700.    Past Medical History:  Diagnosis Date  .  Allergy     seasonal  . Breast cancer ([place])    right mastectomy-2008  . Cancer ([place])    Breast Stage IIx2000  . Hyperlipidemia   . Hypertension   . Migraines   . Personal history of chemotherapy    Past Surgical History:  Procedure Laterality Date  . ABDOMINAL HYSTERECTOMY  03/16/2012   uterine prolapse.  Ovaries resected.   [facility].  [facility] BREAST LUMPECTOMY WITH RADIOACTIVE SEED AND AXILLARY LYMPH NODE DISSECTION Right   . COLONOSCOPY  04/17/2007   normal; repeat in 10 years.  [facility]  . COLPOPEXY  05/17/13   [facility]  . MASTECTOMY MODIFIED RADICAL Right 2008   s/p chemotherapy   Allergies  Allergen Reactions  . Eucalyptus Oil Anaphylaxis  . Other Shortness Of Breath    Euculyptus - causes shortness of breath  . Codeine Other (See Comments)    hallunication  . Eucalyptus Flavor [Flavoring Agent] Other (See Comments)    Causes difficulty in breathing   Current Outpatient Medications on File Prior to Visit  Medication Sig Dispense Refill  . aspirin 81 MG tablet Take 81 mg by mouth daily.    . Cholecalciferol (VITAMIN D-3 PO) Take 1,000 Units by mouth daily.    . fluticasone (FLONASE) 50 MCG/ACT nasal spray USE 2 SPRAYS INTO EACH NOSTRIL DAILY. 48 g 3  . meclizine (ANTIVERT) 25 MG tablet Take 1 tablet (25 mg total) by mouth 3 (three) times daily as needed for dizziness. 30 tablet 0  . polyethylene glycol powder (MIRALAX) powder Take 17 g by mouth.     . SUMAtriptan (IMITREX) 100 MG tablet Take 0.5 tablets (50 mg total) by mouth every 2 (two) hours as needed for migraine. May repeat in 2 hours once. 8 tablet 5   No current facility-administered medications on file prior to visit.    Social History   Socioeconomic History  . Marital status: Married    Spouse name: Not on file  . Number of children: 1  . Years of education: Not on file  . Highest education level: Not on file  Social Needs  . Financial resource strain: Not on file  . Food insecurity - worry: Not on file  .  Food insecurity - inability: Not on file  . Transportation needs - medical: Not on file  . Transportation needs - non-medical: Not on file  Occupational History  . Occupation: Tax inspector: [employer]    Comment: retired from [employer] in 2012; taught first Financial trader entire career x 34 years  Tobacco Use  . Smoking status: Never Smoker  . Smokeless tobacco: Never Used  Substance and Sexual Activity  . Alcohol use: No    Alcohol/week: 0.0 oz  . Drug use: No  . Sexual activity: Yes    Birth control/protection: Post-menopausal, Surgical  Other Topics Concern  . Not on file  Social History Narrative   Marital status:  Married x 37 years; happily married; no abuse.      Children: one daughter (80) [place]; no grandchildren.      Lives: with husband in [place].      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  Working at [employer] 25 hours per week.      Tobacco:  Never.      Alcohol: none      Drugs: none      Exercise:  Job very physical; walking during the week.  Walking minimally in 2018.  Seatbelt:  100%; no texting while driving.      Guns: loaded; unsecured.      Sunscreen:  None; rare sun exposure.      Advanced Directives: YES.  FULL CODE no prolonged measures.        ADLs:  Independent with ADLs.   Family History  Problem Relation Age of Onset  . Stroke Mother        TIAs in 52s.  . Hypertension Mother   . Heart disease Father 59       AMI age 73  . Hypertension Father   . Drug abuse Sister   . Cancer Sister        CLL  . Cervical cancer Sister   . Cancer Sister        Cervical cancer  . Breast cancer Neg Hx        Objective:    BP 138/82   Pulse 88   Temp 98.4 F (36.9 C) (Oral)   Resp 16   Ht 5' 10.67" (1.795 m)   Wt 176 lb 12.8 oz (80.2 kg)   SpO2 98%   BMI 24.89 kg/m  Physical Exam  Constitutional: She is oriented to person, place, and time. She appears well-developed and well-nourished. No distress.  HENT:   Head: Normocephalic and atraumatic.  Right Ear: Hearing, tympanic membrane, external ear and ear canal normal.  Left Ear: Hearing, tympanic membrane, external ear and ear canal normal.  Nose: Nose normal.  Mouth/Throat: Oropharynx is clear and moist.  Eyes: Conjunctivae and EOM are normal. Pupils are equal, round, and reactive to light.  Neck: Normal range of motion and full passive range of motion without pain. Neck supple. No JVD present. Carotid bruit is not present. No thyromegaly present.  Cardiovascular: Normal rate, regular rhythm, normal heart sounds and intact distal pulses. Exam reveals no gallop and no friction rub.  No murmur heard. Pulmonary/Chest: Effort normal and breath sounds normal. No respiratory distress. She has no wheezes. She has no rales. Right breast exhibits no inverted nipple, no mass, no nipple discharge, no skin change and no tenderness. Left breast exhibits no inverted nipple, no mass, no nipple discharge, no skin change and no tenderness. Breasts are symmetrical.  Mastectomy with well healed incisions present.  Abdominal: Soft. Bowel sounds are normal. She exhibits no distension and no mass. There is no tenderness. There is no rebound and no guarding.  Musculoskeletal:       Right shoulder: Normal.       Left shoulder: Normal.       Cervical back: Normal.  Lymphadenopathy:    She has no cervical adenopathy.  Neurological: She is alert and oriented to person, place, and time. She has normal reflexes. No cranial nerve deficit. She exhibits normal muscle tone. Coordination normal.  Skin: Skin is warm and dry. No rash noted. She is not diaphoretic. No erythema. No pallor.  Psychiatric: She has a normal mood and affect. Her behavior is normal. Judgment and thought content normal.  Nursing note and vitals reviewed.  No results found. Depression screen [facility] 2/9 06/24/2017 12/25/2016 11/14/2016 06/19/2016 05/01/2016  Decreased Interest 0 0 0 0 0  Down, Depressed, Hopeless 0  0 0 0 0  PHQ - 2 Score 0 0 0 0 0   Fall Risk  06/24/2017 12/25/2016 11/14/2016 06/19/2016 05/01/2016  Falls in the past year? No No No No No        Assessment & Plan:   1. Welcome to  Medicare preventive visit   2. Essential hypertension, benign   3. Seasonal allergic rhinitis due to pollen   4. Pure hypercholesterolemia   5. Malignant neoplasm of upper-outer quadrant of right breast in female, estrogen receptor positive ([place])   6. Hyperglycemia   7. Need for prophylactic vaccination against Streptococcus pneumoniae (pneumococcus)   8. Estrogen deficiency     -anticipatory guidance provided --- exercise, weight loss, safe driving practices, aspirin 81mg  daily. -obtain age appropriate screening labs and labs for chronic disease management. -moderate fall risk; no evidence of depression; no evidence of hearing loss.  Discussed advanced directives and living will; also discussed end of life issues including code status.  -hypertension and hypercholesterolemia well controlled; obtain labs; refills provided.   Orders Placed This Encounter  Procedures  . DG Bone Density    Standing Status:   Future    Standing Expiration Date:   08/25/2018    Order Specific Question:   Reason for Exam (SYMPTOM  OR DIAGNOSIS REQUIRED)    Answer:   estrogen deficiency    Order Specific Question:   Preferred imaging location?    Answer:   [facility]  . MM Digital Diagnostic Unilat L    Standing Status:   Future    Standing Expiration Date:   08/25/2018    Order Specific Question:   Reason for Exam (SYMPTOM  OR DIAGNOSIS REQUIRED)    Answer:   history of breast cancer B    Order Specific Question:   Preferred imaging location?    Answer:   [facility]  . Pneumococcal conjugate vaccine 13-valent IM  . CBC with Differential/Platelet  . Comprehensive metabolic panel    Order Specific Question:   Has the patient fasted?    Answer:   No  . Hemoglobin A1c  . Lipid panel    Order Specific Question:    Has the patient fasted?    Answer:   No  . TSH  . POCT urinalysis dipstick  . EKG 12-Lead   Meds ordered this encounter  Medications  . hydrochlorothiazide (HYDRODIURIL) 25 MG tablet    Sig: Take 1 tablet (25 mg total) by mouth daily.    Dispense:  90 tablet    Refill:  3  . lisinopril (PRINIVIL,ZESTRIL) 10 MG tablet    Sig: Take 1 tablet (10 mg total) by mouth daily.    Dispense:  90 tablet    Refill:  3  . simvastatin (ZOCOR) 10 MG tablet    Sig: Take 1 tablet (10 mg total) by mouth every evening.    Dispense:  90 tablet    Refill:  3    Return in about 6 months (around 12/25/2017) for follow-up chronic medical conditions.   [name], M.D. Primary Care at [facility] [address] [place], [place]  [address] [phone] phone [phone] fax

## 2017-06-25 LAB — COMPREHENSIVE METABOLIC PANEL
ALBUMIN: 5 g/dL — AB (ref 3.6–4.8)
ALK PHOS: 69 IU/L (ref 39–117)
ALT: 10 IU/L (ref 0–32)
AST: 13 IU/L (ref 0–40)
Albumin/Globulin Ratio: 1.7 (ref 1.2–2.2)
BUN / CREAT RATIO: 11 — AB (ref 12–28)
BUN: 8 mg/dL (ref 8–27)
Bilirubin Total: 0.5 mg/dL (ref 0.0–1.2)
CO2: 26 mmol/L (ref 20–29)
CREATININE: 0.74 mg/dL (ref 0.57–1.00)
Calcium: 9.9 mg/dL (ref 8.7–10.3)
Chloride: 93 mmol/L — ABNORMAL LOW (ref 96–106)
GFR calc non Af Amer: 85 mL/min/{1.73_m2} (ref >59)
GFR, EST AFRICAN AMERICAN: 98 mL/min/{1.73_m2} (ref >59)
GLOBULIN, TOTAL: 2.9 g/dL (ref 1.5–4.5)
Glucose: 100 mg/dL — ABNORMAL HIGH (ref 65–99)
Potassium: 4.1 mmol/L (ref 3.5–5.2)
SODIUM: 137 mmol/L (ref 134–144)
TOTAL PROTEIN: 7.9 g/dL (ref 6.0–8.5)

## 2017-06-25 LAB — CBC WITH DIFFERENTIAL/PLATELET
Basophils Absolute: 0.1 10*3/uL (ref 0.0–0.2)
Basos: 2 %
EOS (ABSOLUTE): 0.3 10*3/uL (ref 0.0–0.4)
EOS: 7 %
HEMATOCRIT: 38.4 % (ref 34.0–46.6)
Hemoglobin: 12.8 g/dL (ref 11.1–15.9)
Immature Grans (Abs): 0 10*3/uL (ref 0.0–0.1)
Immature Granulocytes: 0 %
LYMPHS ABS: 1.3 10*3/uL (ref 0.7–3.1)
Lymphs: 34 %
MCH: 30.5 pg (ref 26.6–33.0)
MCHC: 33.3 g/dL (ref 31.5–35.7)
MCV: 91 fL (ref 79–97)
Monocytes Absolute: 0.4 10*3/uL (ref 0.1–0.9)
Monocytes: 10 %
NEUTROS ABS: 1.9 10*3/uL (ref 1.4–7.0)
Neutrophils: 47 %
Platelets: 416 10*3/uL — ABNORMAL HIGH (ref 150–379)
RBC: 4.2 x10E6/uL (ref 3.77–5.28)
RDW: 13.1 % (ref 12.3–15.4)
WBC: 4 10*3/uL (ref 3.4–10.8)

## 2017-06-25 LAB — LIPID PANEL
Chol/HDL Ratio: 2.7 ratio (ref 0.0–4.4)
Cholesterol, Total: 174 mg/dL (ref 100–199)
HDL: 64 mg/dL (ref >39)
LDL Calculated: 91 mg/dL (ref 0–99)
TRIGLYCERIDES: 96 mg/dL (ref 0–149)
VLDL Cholesterol Cal: 19 mg/dL (ref 5–40)

## 2017-06-25 LAB — HEMOGLOBIN A1C
Est. average glucose Bld gHb Est-mCnc: 120 mg/dL
HEMOGLOBIN A1C: 5.8 % — AB (ref 4.8–5.6)

## 2017-06-25 LAB — TSH: TSH: 2.89 u[IU]/mL (ref 0.450–4.500)

## 2017-07-24 ENCOUNTER — Encounter: Payer: Self-pay | Admitting: Physician Assistant

## 2017-07-25 ENCOUNTER — Other Ambulatory Visit: Payer: Self-pay | Admitting: Physician Assistant

## 2017-07-25 DIAGNOSIS — J301 Allergic rhinitis due to pollen: Secondary | ICD-10-CM

## 2017-09-10 ENCOUNTER — Ambulatory Visit
Admission: RE | Admit: 2017-09-10 | Discharge: 2017-09-10 | Disposition: A | Discharge status: Home / Self Care | Payer: Medicare Other | Source: Ambulatory Visit | Attending: Family Medicine | Admitting: Family Medicine

## 2017-09-10 ENCOUNTER — Encounter: Payer: Self-pay | Admitting: Family Medicine

## 2017-09-10 DIAGNOSIS — E2839 Other primary ovarian failure: Secondary | ICD-10-CM | POA: Diagnosis not present

## 2017-09-10 DIAGNOSIS — Z170 Estrogen receptor positive status [ER+]: Secondary | ICD-10-CM | POA: Diagnosis present

## 2017-09-10 DIAGNOSIS — C50411 Malignant neoplasm of upper-outer quadrant of right female breast: Secondary | ICD-10-CM | POA: Diagnosis not present

## 2017-09-10 HISTORY — DX: Unspecified malignant neoplasm of skin, unspecified: C44.90

## 2017-12-30 ENCOUNTER — Ambulatory Visit: Payer: Medicare Other | Admitting: Family Medicine

## 2018-07-29 ENCOUNTER — Other Ambulatory Visit: Payer: Self-pay | Admitting: Family Medicine

## 2018-07-29 DIAGNOSIS — Z1231 Encounter for screening mammogram for malignant neoplasm of breast: Secondary | ICD-10-CM

## 2018-09-15 ENCOUNTER — Ambulatory Visit
Admission: RE | Admit: 2018-09-15 | Discharge: 2018-09-15 | Disposition: A | Discharge status: Home / Self Care | Payer: Medicare Other | Source: Ambulatory Visit | Attending: Family Medicine | Admitting: Family Medicine

## 2018-09-15 ENCOUNTER — Other Ambulatory Visit: Payer: Self-pay

## 2018-09-15 DIAGNOSIS — Z1231 Encounter for screening mammogram for malignant neoplasm of breast: Secondary | ICD-10-CM | POA: Diagnosis not present

## 2019-03-31 ENCOUNTER — Encounter: Payer: Self-pay | Admitting: *Deleted

## 2019-08-20 ENCOUNTER — Other Ambulatory Visit: Payer: Self-pay | Admitting: Family Medicine

## 2019-08-20 DIAGNOSIS — Z1231 Encounter for screening mammogram for malignant neoplasm of breast: Secondary | ICD-10-CM

## 2019-09-21 ENCOUNTER — Other Ambulatory Visit: Payer: Self-pay

## 2019-09-21 ENCOUNTER — Ambulatory Visit
Admission: RE | Admit: 2019-09-21 | Discharge: 2019-09-21 | Disposition: A | Discharge status: Home / Self Care | Payer: Medicare PPO | Source: Ambulatory Visit | Attending: Family Medicine | Admitting: Family Medicine

## 2019-09-21 DIAGNOSIS — Z1231 Encounter for screening mammogram for malignant neoplasm of breast: Secondary | ICD-10-CM | POA: Insufficient documentation

## 2020-05-17 ENCOUNTER — Other Ambulatory Visit: Payer: Self-pay | Admitting: Family Medicine

## 2020-05-17 DIAGNOSIS — R1011 Right upper quadrant pain: Secondary | ICD-10-CM

## 2020-05-20 ENCOUNTER — Ambulatory Visit
Admission: RE | Admit: 2020-05-20 | Discharge: 2020-05-20 | Disposition: A | Discharge status: Home / Self Care | Payer: Medicare PPO | Source: Ambulatory Visit | Attending: Family Medicine | Admitting: Family Medicine

## 2020-05-20 ENCOUNTER — Other Ambulatory Visit: Payer: Self-pay

## 2020-05-20 ENCOUNTER — Ambulatory Visit: Admission: RE | Admit: 2020-05-20 | Payer: Medicare PPO | Source: Ambulatory Visit

## 2020-05-20 DIAGNOSIS — R1011 Right upper quadrant pain: Secondary | ICD-10-CM | POA: Insufficient documentation

## 2020-09-06 ENCOUNTER — Other Ambulatory Visit: Payer: Self-pay | Admitting: Family Medicine

## 2020-09-06 DIAGNOSIS — Z1231 Encounter for screening mammogram for malignant neoplasm of breast: Secondary | ICD-10-CM

## 2020-09-21 ENCOUNTER — Ambulatory Visit
Admission: RE | Admit: 2020-09-21 | Discharge: 2020-09-21 | Disposition: A | Discharge status: Home / Self Care | Payer: Medicare PPO | Source: Ambulatory Visit | Attending: Family Medicine | Admitting: Family Medicine

## 2020-09-21 ENCOUNTER — Other Ambulatory Visit: Payer: Self-pay

## 2020-09-21 DIAGNOSIS — Z1231 Encounter for screening mammogram for malignant neoplasm of breast: Secondary | ICD-10-CM | POA: Insufficient documentation

## 2020-11-28 ENCOUNTER — Telehealth (INDEPENDENT_AMBULATORY_CARE_PROVIDER_SITE_OTHER): Payer: Self-pay | Admitting: Gastroenterology

## 2020-11-28 DIAGNOSIS — Z1211 Encounter for screening for malignant neoplasm of colon: Secondary | ICD-10-CM

## 2020-11-28 MED ORDER — NA SULFATE-K SULFATE-MG SULF 17.5-3.13-1.6 GM/177ML PO SOLN: 1.0000 | Freq: Once | ORAL | 0 refills | Status: AC

## 2020-11-28 NOTE — Progress Notes (Signed)
Gastroenterology Pre-Procedure Review  Request Date: 12/12/2020 Requesting Physician: Dr. Allen Norris  PATIENT REVIEW QUESTIONS: The patient responded to the following health history questions as indicated:    1. Are you having any GI issues? no 2. Do you have a personal history of Polyps? no 3. Do you have a family history of Colon Cancer or Polyps? no 4. Diabetes Mellitus? no 5. Joint replacements in the past 12 months?no 6. Major health problems in the past 3 months?no 7. Any artificial heart valves, MVP, or defibrillator?no    MEDICATIONS & ALLERGIES:    Patient reports the following regarding taking any anticoagulation/antiplatelet therapy:   Plavix, Coumadin, Eliquis, Xarelto, Lovenox, Pradaxa, Brilinta, or Effient? no Aspirin? yes (81MG)  Patient confirms/reports the following medications:  Current Outpatient Medications  Medication Sig Dispense Refill   aspirin 81 MG tablet Take 81 mg by mouth daily.     Cholecalciferol (VITAMIN D-3 PO) Take 1,000 Units by mouth daily.     fluticasone (FLONASE) 50 MCG/ACT nasal spray USE 2 SPRAYS INTO EACH NOSTRIL DAILY. 48 g 3   lisinopril (PRINIVIL,ZESTRIL) 10 MG tablet Take 1 tablet (10 mg total) by mouth daily. 90 tablet 3   Na Sulfate-K Sulfate-Mg Sulf 17.5-3.13-1.6 GM/177ML SOLN Take 1 kit by mouth once for 1 dose. 354 mL 0   omeprazole (PRILOSEC) 20 MG capsule Take 20 mg by mouth daily.     polyethylene glycol powder (GLYCOLAX/MIRALAX) 17 GM/SCOOP powder Take 17 g by mouth.      simvastatin (ZOCOR) 20 MG tablet Take by mouth.     hydrochlorothiazide (HYDRODIURIL) 25 MG tablet Take 1 tablet (25 mg total) by mouth daily. (Patient not taking: Reported on 11/28/2020) 90 tablet 3   meclizine (ANTIVERT) 25 MG tablet Take 1 tablet (25 mg total) by mouth 3 (three) times daily as needed for dizziness. (Patient not taking: Reported on 11/28/2020) 30 tablet 0   simvastatin (ZOCOR) 10 MG tablet Take 1 tablet (10 mg total) by mouth every evening. (Patient  not taking: Reported on 11/28/2020) 90 tablet 3   SUMAtriptan (IMITREX) 100 MG tablet Take 0.5 tablets (50 mg total) by mouth every 2 (two) hours as needed for migraine. May repeat in 2 hours once. (Patient not taking: Reported on 11/28/2020) 8 tablet 5   No current facility-administered medications for this visit.    Patient confirms/reports the following allergies:  Allergies  Allergen Reactions   Eucalyptus Oil Anaphylaxis   Other Shortness Of Breath    Euculyptus - causes shortness of breath   Codeine Other (See Comments)    hallunication   Eucalyptus Flavor [Flavoring Agent] Other (See Comments)    Causes difficulty in breathing    No orders of the defined types were placed in this encounter.   AUTHORIZATION INFORMATION Primary Insurance: 1D#: Group #:  Secondary Insurance: 1D#: Group #:  SCHEDULE INFORMATION: Date: 12/12/2020 Time: Location: [place]

## 2020-11-30 ENCOUNTER — Encounter: Payer: Self-pay | Admitting: Gastroenterology

## 2020-12-12 ENCOUNTER — Ambulatory Visit
Admission: RE | Admit: 2020-12-12 | Discharge: 2020-12-12 | Disposition: A | Discharge status: Home / Self Care | Payer: Medicare PPO | Attending: Gastroenterology | Admitting: Gastroenterology

## 2020-12-12 ENCOUNTER — Ambulatory Visit: Payer: Medicare PPO | Admitting: Anesthesiology

## 2020-12-12 ENCOUNTER — Encounter: Payer: Self-pay | Admitting: Gastroenterology

## 2020-12-12 ENCOUNTER — Other Ambulatory Visit: Payer: Self-pay

## 2020-12-12 ENCOUNTER — Encounter
Admission: RE | Disposition: A | Discharge status: Home / Self Care | Payer: Self-pay | Source: Home / Self Care | Attending: Gastroenterology

## 2020-12-12 DIAGNOSIS — K635 Polyp of colon: Secondary | ICD-10-CM | POA: Insufficient documentation

## 2020-12-12 DIAGNOSIS — Z885 Allergy status to narcotic agent status: Secondary | ICD-10-CM | POA: Insufficient documentation

## 2020-12-12 DIAGNOSIS — K640 First degree hemorrhoids: Secondary | ICD-10-CM | POA: Insufficient documentation

## 2020-12-12 DIAGNOSIS — Z853 Personal history of malignant neoplasm of breast: Secondary | ICD-10-CM | POA: Diagnosis not present

## 2020-12-12 DIAGNOSIS — Z79899 Other long term (current) drug therapy: Secondary | ICD-10-CM | POA: Insufficient documentation

## 2020-12-12 DIAGNOSIS — Z9221 Personal history of antineoplastic chemotherapy: Secondary | ICD-10-CM | POA: Insufficient documentation

## 2020-12-12 DIAGNOSIS — Z7982 Long term (current) use of aspirin: Secondary | ICD-10-CM | POA: Diagnosis not present

## 2020-12-12 DIAGNOSIS — Z85828 Personal history of other malignant neoplasm of skin: Secondary | ICD-10-CM | POA: Insufficient documentation

## 2020-12-12 DIAGNOSIS — Z1211 Encounter for screening for malignant neoplasm of colon: Secondary | ICD-10-CM | POA: Insufficient documentation

## 2020-12-12 HISTORY — DX: Nausea with vomiting, unspecified: R11.2

## 2020-12-12 HISTORY — PX: COLONOSCOPY WITH PROPOFOL: SHX5780

## 2020-12-12 HISTORY — DX: Gastro-esophageal reflux disease without esophagitis: K21.9

## 2020-12-12 HISTORY — PX: POLYPECTOMY: SHX5525

## 2020-12-12 HISTORY — DX: Nausea with vomiting, unspecified: Z98.890

## 2020-12-12 SURGERY — COLONOSCOPY WITH PROPOFOL
Anesthesia: Monitor Anesthesia Care | Site: Rectum

## 2020-12-12 MED ORDER — ONDANSETRON HCL 4 MG/2ML IJ SOLN: 4.0000 mg | Freq: Once | INTRAMUSCULAR | Status: DC | PRN

## 2020-12-12 MED ORDER — LIDOCAINE HCL (CARDIAC) PF 100 MG/5ML IV SOSY: PREFILLED_SYRINGE | INTRAVENOUS | Status: DC | PRN

## 2020-12-12 MED ORDER — STERILE WATER FOR IRRIGATION IR SOLN: Status: DC | PRN

## 2020-12-12 MED ORDER — ACETAMINOPHEN 10 MG/ML IV SOLN: 1000.0000 mg | Freq: Once | INTRAVENOUS | Status: DC | PRN

## 2020-12-12 MED ORDER — SODIUM CHLORIDE 0.9 % IV SOLN: INTRAVENOUS | Status: DC

## 2020-12-12 MED ORDER — LACTATED RINGERS IV SOLN: INTRAVENOUS | Status: DC

## 2020-12-12 MED ORDER — PROPOFOL 10 MG/ML IV BOLUS
INTRAVENOUS | Status: DC | PRN
  Administered 2020-12-12: 150 mg via INTRAVENOUS
  Administered 2020-12-12: 30 mg via INTRAVENOUS
  Administered 2020-12-12: 40 mg via INTRAVENOUS
  Administered 2020-12-12: 30 mg via INTRAVENOUS
  Administered 2020-12-12: 40 mg via INTRAVENOUS

## 2020-12-12 SURGICAL SUPPLY — 7 items
FORCEPS BIOP RAD 4 LRG CAP 4 (CUTTING FORCEPS) ×1 IMPLANT
GOWN CVR UNV OPN BCK APRN NK (MISCELLANEOUS) ×2 IMPLANT
GOWN ISOL THUMB LOOP REG UNIV (MISCELLANEOUS) ×4
KIT PRC NS LF DISP ENDO (KITS) ×1 IMPLANT
KIT PROCEDURE OLYMPUS (KITS) ×2
MANIFOLD NEPTUNE II (INSTRUMENTS) ×2 IMPLANT
WATER STERILE IRR 250ML POUR (IV SOLUTION) ×2 IMPLANT

## 2020-12-12 NOTE — Transfer of Care (Signed)
Immediate Anesthesia Transfer of Care Note  Patient: [name]  Procedure(s) Performed: COLONOSCOPY WITH BIOPSY (Rectum) POLYPECTOMY (Rectum)  Patient Location: PACU  Anesthesia Type: MAC  Level of Consciousness: awake, alert  and patient cooperative  Airway and Oxygen Therapy: Patient Spontanous Breathing and Patient connected to supplemental oxygen  Post-op Assessment: Post-op Vital signs reviewed, Patient's Cardiovascular Status Stable, Respiratory Function Stable, Patent Airway and No signs of Nausea or vomiting  Post-op Vital Signs: Reviewed and stable  Complications: No notable events documented.

## 2020-12-12 NOTE — H&P (Signed)
Lucilla Lame, MD [facility] [address] [place], [place] [address] Phone: [phone] Fax : [phone]  Primary Care Physician:  Wardell Honour, MD Primary Gastroenterologist:  Dr. Allen Norris  Pre-Procedure History & Physical: HPI:  [name] is a 70 y.o. female is here for a screening colonoscopy.   Past Medical History:  Diagnosis Date   Allergy    seasonal   Breast cancer ([place])    right mastectomy-2008   Cancer ([place])    Breast Stage IIx2000   GERD (gastroesophageal reflux disease)    Hyperlipidemia    Hypertension    Migraines    Personal history of chemotherapy    PONV (postoperative nausea and vomiting)    Skin cancer    skin ca    Past Surgical History:  Procedure Laterality Date   ABDOMINAL HYSTERECTOMY  03/16/2012   uterine prolapse.  Ovaries resected.   [facility].   BREAST LUMPECTOMY WITH RADIOACTIVE SEED AND AXILLARY LYMPH NODE DISSECTION Right    COLONOSCOPY  04/17/2007   normal; repeat in 10 years.  [facility]   COLPOPEXY  05/17/13   [facility]   MASTECTOMY MODIFIED RADICAL Right 2008   s/p chemotherapy    Prior to Admission medications   Medication Sig Start Date End Date Taking? Authorizing Provider  aspirin 81 MG tablet Take 81 mg by mouth daily.   Yes [provider]  Cholecalciferol (VITAMIN D-3 PO) Take 1,000 Units by mouth daily.   Yes [provider]  fluticasone (FLONASE) 50 MCG/ACT nasal spray USE 2 SPRAYS INTO EACH NOSTRIL DAILY. 07/25/17  Yes Wardell Honour, MD  lisinopril (PRINIVIL,ZESTRIL) 10 MG tablet Take 1 tablet (10 mg total) by mouth daily. 06/24/17  Yes Wardell Honour, MD  omeprazole (PRILOSEC) 20 MG capsule Take 20 mg by mouth daily.   Yes [provider]  simvastatin (ZOCOR) 20 MG tablet Take by mouth. 02/24/13  Yes [provider]  spironolactone (ALDACTONE) 25 MG tablet Take 25 mg by mouth daily.   Yes [provider]  hydrochlorothiazide (HYDRODIURIL) 25 MG tablet Take 1  tablet (25 mg total) by mouth daily. Patient not taking: Reported on 11/28/2020 06/24/17   Wardell Honour, MD  meclizine (ANTIVERT) 25 MG tablet Take 1 tablet (25 mg total) by mouth 3 (three) times daily as needed for dizziness. Patient not taking: Reported on 11/28/2020 11/14/16   Ivar Drape D, PA  polyethylene glycol powder (GLYCOLAX/MIRALAX) 17 GM/SCOOP powder Take 17 g by mouth.     [provider]  simvastatin (ZOCOR) 10 MG tablet Take 1 tablet (10 mg total) by mouth every evening. Patient not taking: Reported on 11/28/2020 06/24/17   Wardell Honour, MD  SUMAtriptan (IMITREX) 100 MG tablet Take 0.5 tablets (50 mg total) by mouth every 2 (two) hours as needed for migraine. May repeat in 2 hours once. Patient not taking: Reported on 11/28/2020 03/18/15   Wardell Honour, MD    Allergies as of 11/28/2020 - Review Complete 11/28/2020  Allergen Reaction Noted   Eucalyptus oil Anaphylaxis 03/01/2014   Other Shortness Of Breath 05/26/2013   Codeine Other (See Comments) 09/29/2010   Eucalyptus flavor [flavoring agent] Other (See Comments) 08/11/2012    Family History  Problem Relation Age of Onset   Stroke Mother        TIAs in 2s.   Hypertension Mother    Heart disease Father 59       AMI age 27   Hypertension Father  Drug abuse Sister    Cancer Sister        CLL   Cervical cancer Sister    Cancer Sister        Cervical cancer   Breast cancer Neg Hx     Social History   Socioeconomic History   Marital status: Married    Spouse name: Not on file   Number of children: 1   Years of education: Not on file   Highest education level: Not on file  Occupational History   Occupation: Tax inspector: [employer]    Comment: retired from [employer] in 2012; taught first Financial trader entire career x 34 years  Tobacco Use   Smoking status: Never   Smokeless tobacco: Never  Substance and Sexual Activity   Alcohol use: No    Alcohol/week: 0.0 standard  drinks   Drug use: No   Sexual activity: Yes    Birth control/protection: Post-menopausal, Surgical  Other Topics Concern   Not on file  Social History Narrative   Marital status:  Married x 15 years; happily married; no abuse.      Children: one daughter (23) [place]; no grandchildren.      Lives: with husband in [place].      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  Working at [employer] 25 hours per week.      Tobacco:  Never.      Alcohol: none      Drugs: none      Exercise:  Job very physical; walking during the week.  Walking minimally in 2018.      Seatbelt:  100%; no texting while driving.      Guns: loaded; unsecured.      Sunscreen:  None; rare sun exposure.      Advanced Directives: YES.  FULL CODE no prolonged measures.        ADLs:  Independent with ADLs.   Social Determinants of Health   Financial Resource Strain: Not on file  Food Insecurity: Not on file  Transportation Needs: Not on file  Physical Activity: Not on file  Stress: Not on file  Social Connections: Not on file  Intimate Partner Violence: Not on file    Review of Systems: See HPI, otherwise negative ROS  Physical Exam: BP (!) 150/84   Pulse 88   Temp 97.6 F (36.4 C) (Temporal)   Ht '5\' 10"'$  (1.778 m)   Wt 80.3 kg   SpO2 98%   BMI 25.40 kg/m  General:   Alert,  pleasant and cooperative in NAD Head:  Normocephalic and atraumatic. Neck:  Supple; no masses or thyromegaly. Lungs:  Clear throughout to auscultation.    Heart:  Regular rate and rhythm. Abdomen:  Soft, nontender and nondistended. Normal bowel sounds, without guarding, and without rebound.   Neurologic:  Alert and  oriented x4;  grossly normal neurologically.  Impression/Plan: [name] is now here to undergo a screening colonoscopy.  Risks, benefits, and alternatives regarding colonoscopy have been reviewed with the patient.  Questions have been answered.  All parties agreeable.

## 2020-12-12 NOTE — Op Note (Signed)
[facility] Gastroenterology Patient Name: [name] Procedure Date: 12/12/2020 10:35 AM MRN: [id number] Account #: [id number] Date of Birth: 1950/06/19 Admit Type: Outpatient Age: 70 Room: [place] OR ROOM 01 Gender: Female Note Status: Finalized Procedure:             Colonoscopy Indications:           Screening for colorectal malignant neoplasm Providers:             Lucilla Lame MD, MD Referring MD:          Renette Butters. Tamala Julian, MD (Referring MD) Medicines:             Propofol per Anesthesia Complications:         No immediate complications. Procedure:             Pre-Anesthesia Assessment:                        - Prior to the procedure, a History and Physical was                         performed, and patient medications and allergies were                         reviewed. The patient's tolerance of previous                         anesthesia was also reviewed. The risks and benefits                         of the procedure and the sedation options and risks                         were discussed with the patient. All questions were                         answered, and informed consent was obtained. Prior                         Anticoagulants: The patient has taken no previous                         anticoagulant or antiplatelet agents. ASA Grade                         Assessment: II - A patient with mild systemic disease.                         After reviewing the risks and benefits, the patient                         was deemed in satisfactory condition to undergo the                         procedure.                        After obtaining informed consent, the colonoscope was  passed under direct vision. Throughout the procedure,                         the patient's blood pressure, pulse, and oxygen                         saturations were monitored continuously. The was                         introduced through the anus and  advanced to the the                         cecum, identified by appendiceal orifice and ileocecal                         valve. The colonoscopy was performed without                         difficulty. The patient tolerated the procedure well.                         The quality of the bowel preparation was excellent. Findings:      The perianal and digital rectal examinations were normal.      A 3 mm polyp was found in the transverse colon. The polyp was sessile.       The polyp was removed with a cold biopsy forceps. Resection and       retrieval were complete.      Non-bleeding internal hemorrhoids were found during retroflexion. The       hemorrhoids were Grade I (internal hemorrhoids that do not prolapse). Impression:            - One 3 mm polyp in the transverse colon, removed with                         a cold biopsy forceps. Resected and retrieved.                        - Non-bleeding internal hemorrhoids. Recommendation:        - Discharge patient to home.                        - Resume previous diet.                        - Continue present medications.                        - Await pathology results.                        - Repeat colonoscopy in 7 years for surveillance if                         adenomaotus otherwise 10 years. Procedure Code(s):     --- Professional ---                        [phone], Colonoscopy, flexible; with biopsy, single or  multiple Diagnosis Code(s):     --- Professional ---                        Z12.11, Encounter for screening for malignant neoplasm                         of colon                        K63.5, Polyp of colon CPT copyright 2019 [employer]. All rights reserved. The codes documented in this report are preliminary and upon coder review may  be revised to meet current compliance requirements. Lucilla Lame MD, MD 12/12/2020 11:01:16 AM This report has been signed electronically. Number of  Addenda: 0 Note Initiated On: 12/12/2020 10:35 AM Scope Withdrawal Time: 0 hours 6 minutes 18 seconds  Total Procedure Duration: 0 hours 14 minutes 11 seconds  Estimated Blood Loss:  Estimated blood loss: none.      [facility]

## 2020-12-12 NOTE — Anesthesia Postprocedure Evaluation (Signed)
Anesthesia Post Note  Patient: [name]  Procedure(s) Performed: COLONOSCOPY WITH BIOPSY (Rectum) POLYPECTOMY (Rectum)     Patient location during evaluation: PACU Anesthesia Type: MAC Level of consciousness: awake and alert Pain management: pain level controlled Vital Signs Assessment: post-procedure vital signs reviewed and stable Respiratory status: spontaneous breathing, nonlabored ventilation, respiratory function stable and patient connected to nasal cannula oxygen Cardiovascular status: stable and blood pressure returned to baseline Postop Assessment: no apparent nausea or vomiting Anesthetic complications: no   No notable events documented.  Ronan Duecker A  Teejay Meader

## 2020-12-12 NOTE — Anesthesia Preprocedure Evaluation (Signed)
Anesthesia Evaluation  Patient identified by MRN, date of birth, ID band Patient awake    Reviewed: Allergy & Precautions, NPO status , Patient's Chart, lab work & pertinent test results, reviewed documented beta blocker date and time   History of Anesthesia Complications (+) PONV and history of anesthetic complications  Airway Mallampati: III  TM Distance: >3 FB Neck ROM: Full    Dental   Pulmonary    breath sounds clear to auscultation       Cardiovascular hypertension, (-) angina(-) DOE  Rhythm:Regular Rate:Normal   HLD   Neuro/Psych  Headaches,    GI/Hepatic GERD  ,  Endo/Other    Renal/GU      Musculoskeletal   Abdominal   Peds  Hematology   Anesthesia Other Findings R breast cancer Skin cancer  Reproductive/Obstetrics                            Anesthesia Physical Anesthesia Plan  ASA: 2  Anesthesia Plan: MAC   Post-op Pain Management:    Induction: Intravenous  PONV Risk Score and Plan: 3 and TIVA, Midazolam, Treatment may vary due to age or medical condition and Ondansetron  Airway Management Planned: Nasal Cannula  Additional Equipment:   Intra-op Plan:   Post-operative Plan:   Informed Consent: I have reviewed the patients History and Physical, chart, labs and discussed the procedure including the risks, benefits and alternatives for the proposed anesthesia with the patient or authorized representative who has indicated his/her understanding and acceptance.       Plan Discussed with: CRNA and Anesthesiologist  Anesthesia Plan Comments:         Anesthesia Quick Evaluation

## 2020-12-12 NOTE — Anesthesia Procedure Notes (Signed)
Date/Time: 12/12/2020 10:42 AM Performed by: Cameron Ali, CRNA Pre-anesthesia Checklist: Patient identified, Emergency Drugs available, Suction available, Timeout performed and Patient being monitored Patient Re-evaluated:Patient Re-evaluated prior to induction Oxygen Delivery Method: Nasal cannula Placement Confirmation: positive ETCO2

## 2020-12-13 ENCOUNTER — Encounter: Payer: Self-pay | Admitting: Gastroenterology

## 2020-12-14 LAB — SURGICAL PATHOLOGY

## 2020-12-15 ENCOUNTER — Encounter: Payer: Self-pay | Admitting: Gastroenterology

## 2021-08-30 ENCOUNTER — Other Ambulatory Visit: Payer: Self-pay | Admitting: Family Medicine

## 2021-08-30 DIAGNOSIS — Z1231 Encounter for screening mammogram for malignant neoplasm of breast: Secondary | ICD-10-CM

## 2021-10-02 ENCOUNTER — Ambulatory Visit
Admission: RE | Admit: 2021-10-02 | Discharge: 2021-10-02 | Disposition: A | Discharge status: Home / Self Care | Payer: Medicare PPO | Source: Ambulatory Visit | Attending: Family Medicine | Admitting: Family Medicine

## 2021-10-02 DIAGNOSIS — Z1231 Encounter for screening mammogram for malignant neoplasm of breast: Secondary | ICD-10-CM | POA: Diagnosis present

## 2022-08-20 ENCOUNTER — Other Ambulatory Visit: Payer: Self-pay | Admitting: Family Medicine

## 2022-08-20 DIAGNOSIS — Z1231 Encounter for screening mammogram for malignant neoplasm of breast: Secondary | ICD-10-CM

## 2022-08-27 ENCOUNTER — Ambulatory Visit (LOCAL_COMMUNITY_HEALTH_CENTER): Payer: Self-pay

## 2022-08-27 DIAGNOSIS — Z111 Encounter for screening for respiratory tuberculosis: Secondary | ICD-10-CM

## 2022-08-30 ENCOUNTER — Ambulatory Visit (LOCAL_COMMUNITY_HEALTH_CENTER): Payer: Self-pay

## 2022-08-30 DIAGNOSIS — Z111 Encounter for screening for respiratory tuberculosis: Secondary | ICD-10-CM

## 2022-08-30 LAB — TB SKIN TEST
Induration: 0 mm
TB Skin Test: NEGATIVE

## 2022-10-04 ENCOUNTER — Ambulatory Visit
Admission: RE | Admit: 2022-10-04 | Discharge: 2022-10-04 | Disposition: A | Discharge status: Home / Self Care | Payer: Medicare PPO | Source: Ambulatory Visit | Attending: Family Medicine | Admitting: Family Medicine

## 2022-10-04 DIAGNOSIS — Z1231 Encounter for screening mammogram for malignant neoplasm of breast: Secondary | ICD-10-CM | POA: Insufficient documentation

## 2022-12-01 IMAGING — US US ABDOMEN LIMITED
1 series · 14 of 25 positions shown · non-contrast
Comparison: None.

CLINICAL DATA: Right upper quadrant abdominal pain

EXAM:
ULTRASOUND ABDOMEN LIMITED RIGHT UPPER QUADRANT

[Series 1: us abdomen limited ruq (liver/gb) · 14 of 49 slices shown]
[im 1/49]
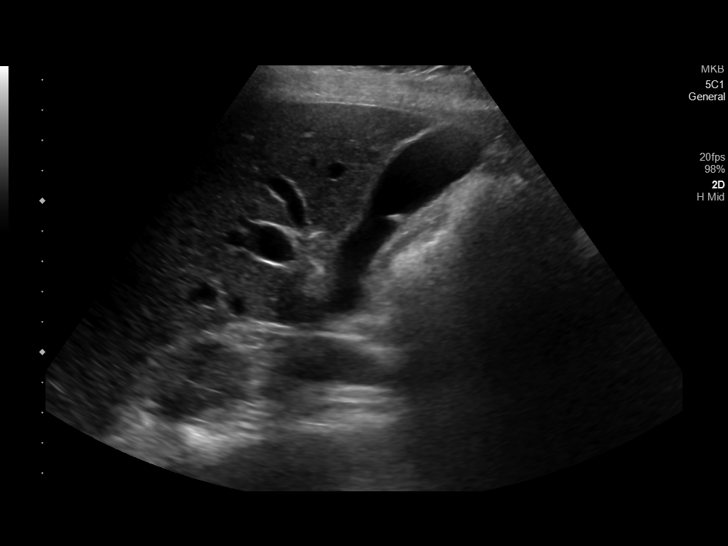
[im 5/49]
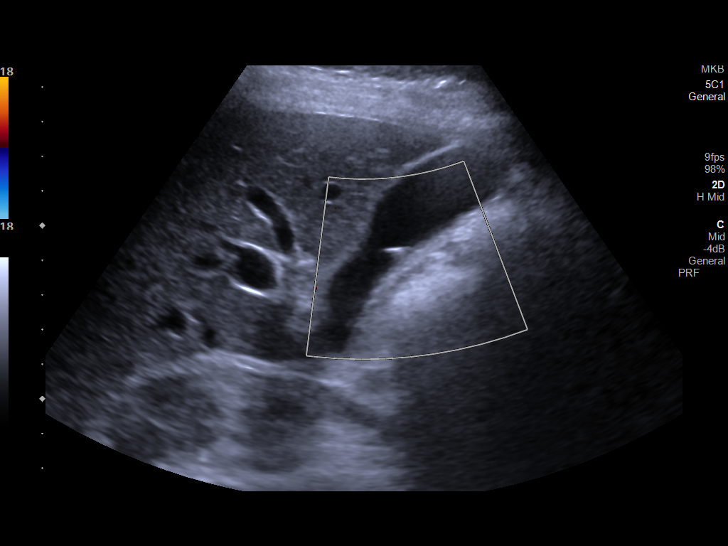
[im 9/49]
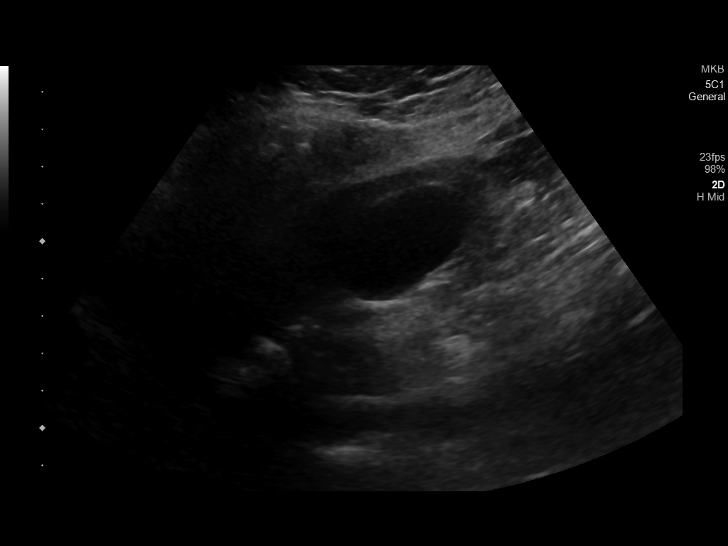
[im 13/49]
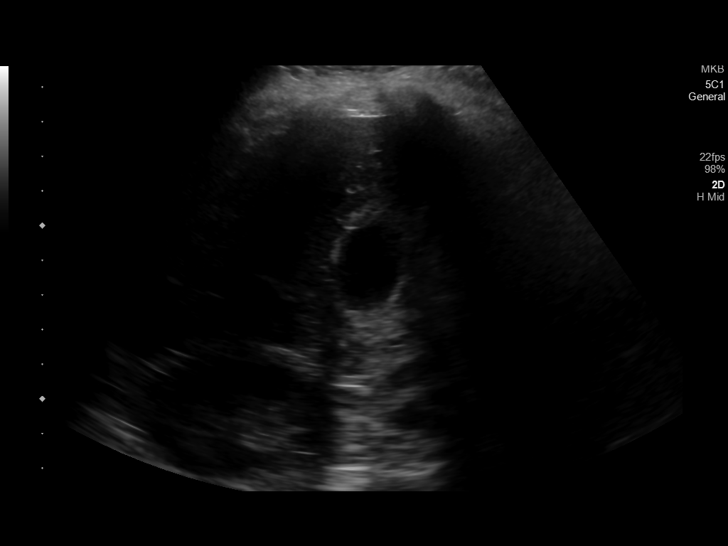
[im 17/49]
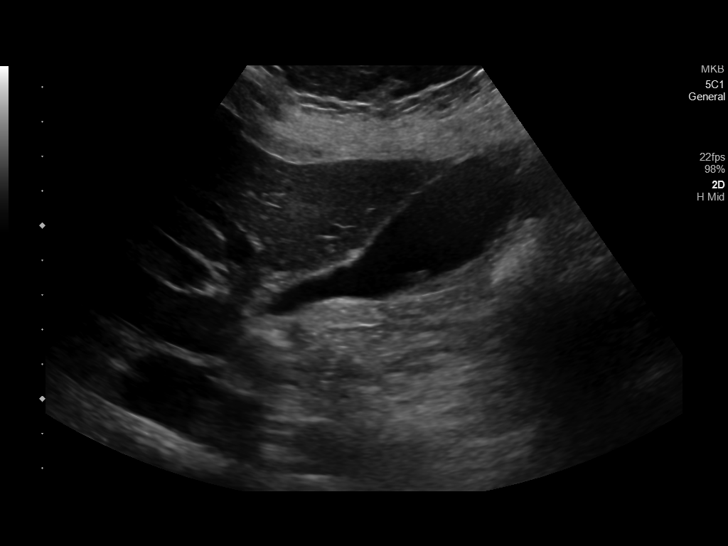
[im 19/49]
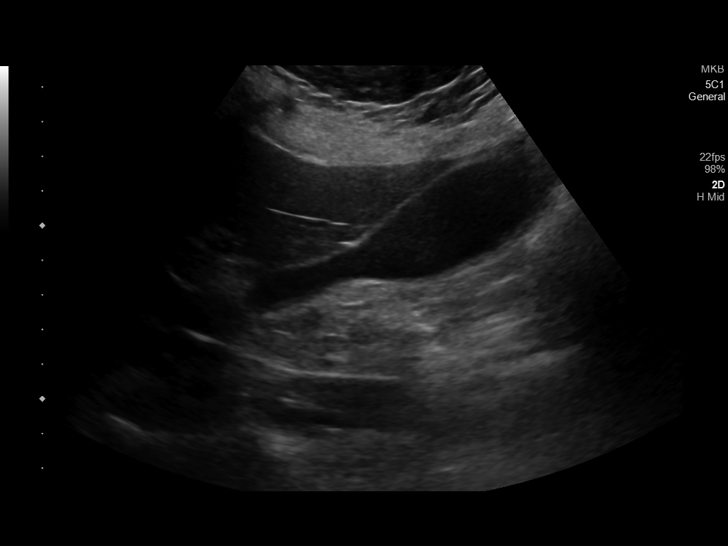
[im 23/49]
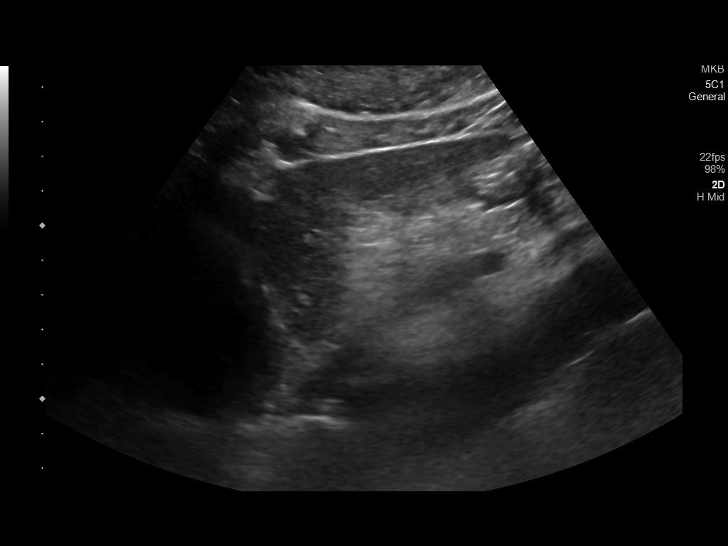
[im 27/49]
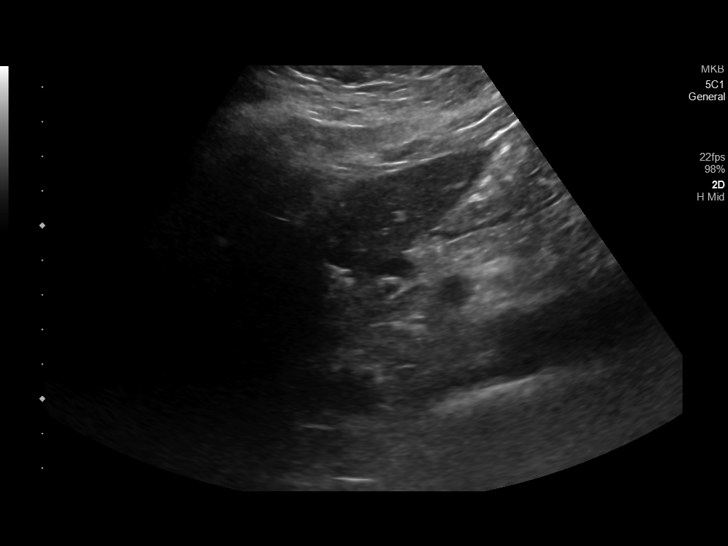
[im 31/49]
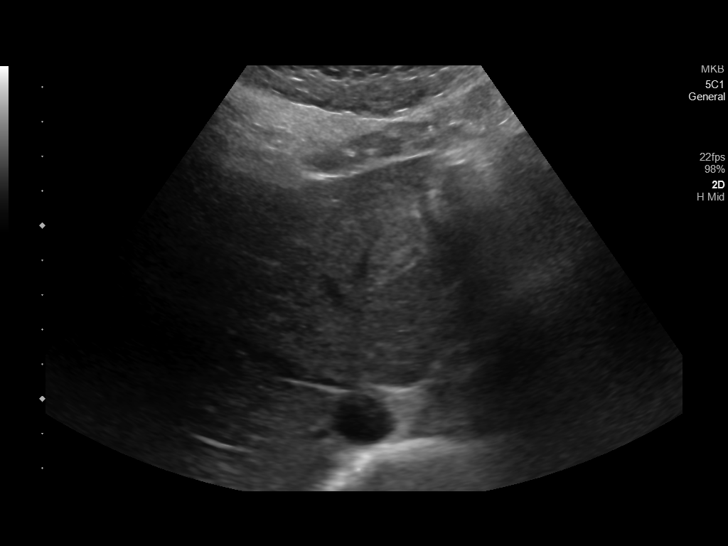
[im 33/49]
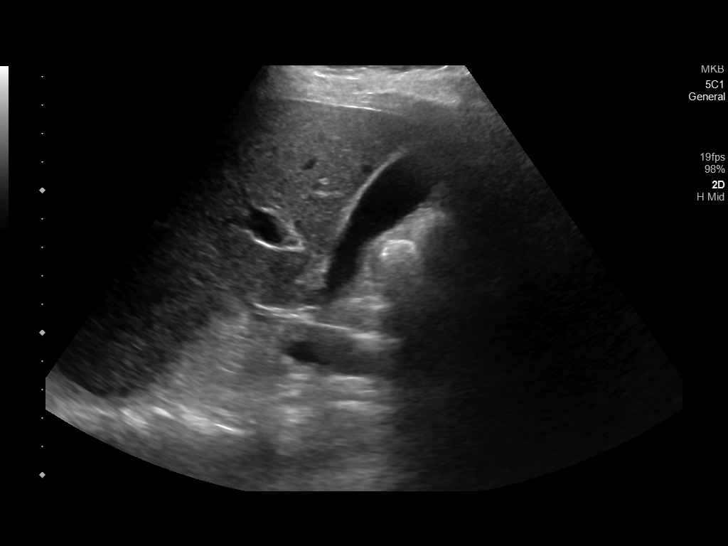
[im 37/49]
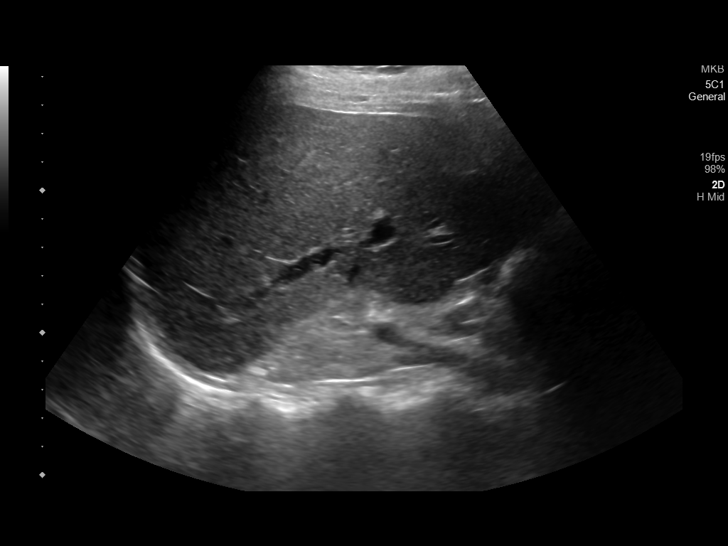
[im 41/49]
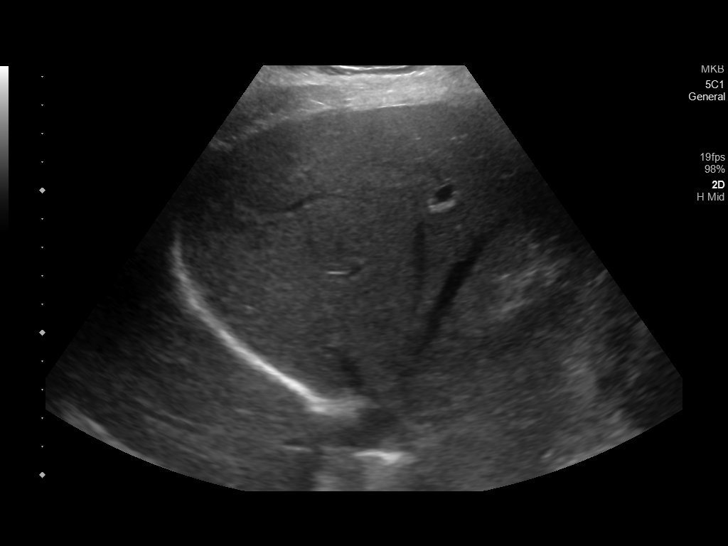
[im 45/49]
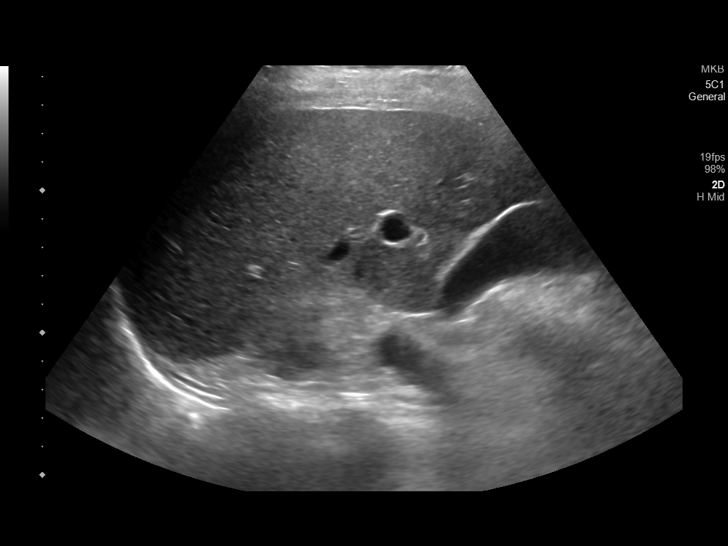
[im 49/49]
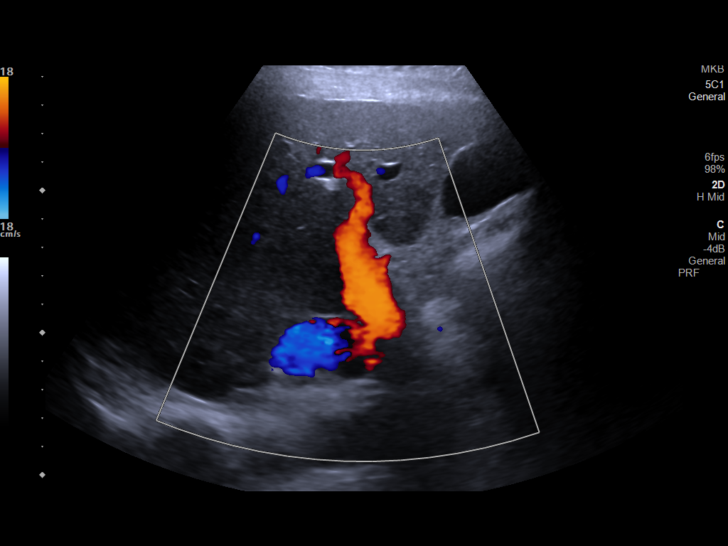

[14 of 25 positions shown; findings below may reference images not displayed]

FINDINGS: Gallbladder:

6 mm echogenic nonmobile nonshadowing focus noted along the
gallbladder wall, which may represent a small polyp versus adherent
sludge ball. No shadowing gallstones or wall thickening visualized.
No sonographic Murphy sign noted by sonographer.

Common bile duct:

Diameter: 3 mm.

Liver:

No focal lesion identified. Within normal limits in parenchymal
echogenicity. Portal vein is patent on color Doppler imaging with
normal direction of blood flow towards the liver.

Other: None.
IMPRESSION: 1. No acute findings in the right upper quadrant. No sonographic
evidence of cholecystitis.
2. Incidental note of 6 mm nonmobile nodular focus along the
gallbladder wall which may reflect a benign polyp or small adherent
sludge ball. No specific follow-up imaging recommended. This
recommendation follows ACR consensus guidelines: White Paper of the
ACR Incidental Findings Committee II on Gallbladder and Biliary
Findings. [HOSPITAL] 5437:;[DATE].

## 2023-04-04 IMAGING — MG DIGITAL SCREENING UNILAT LEFT W/ TOMO W/ CAD
6 series · 6 of 18 positions shown · non-contrast
Comparison: Previous exam(s).

CLINICAL DATA: Screening.

EXAM:
DIGITAL SCREENING UNILATERAL LEFT MAMMOGRAM WITH CAD AND
TOMOSYNTHESIS
TECHNIQUE: Left screening digital craniocaudal and mediolateral oblique
mammograms were obtained. Left screening digital breast
tomosynthesis was performed. The images were evaluated with
computer-aided detection.

[L CC synth-2D (1 of 2)]
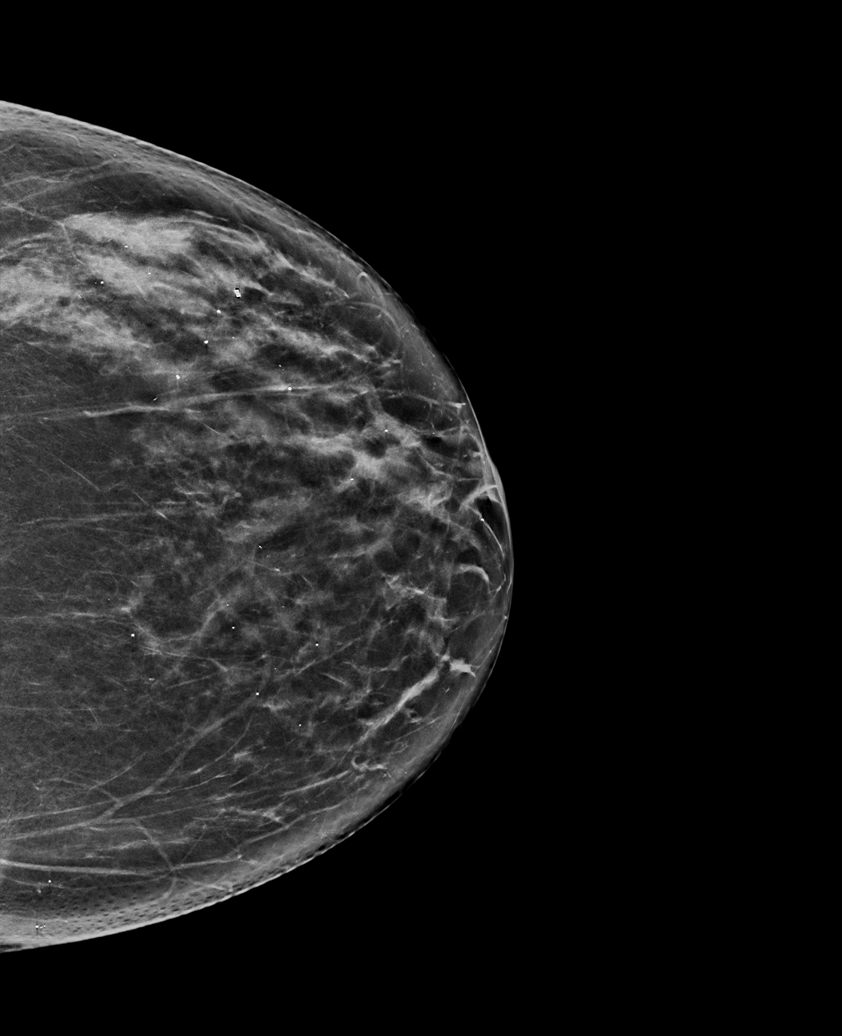

[L CC synth-2D (2 of 2)]
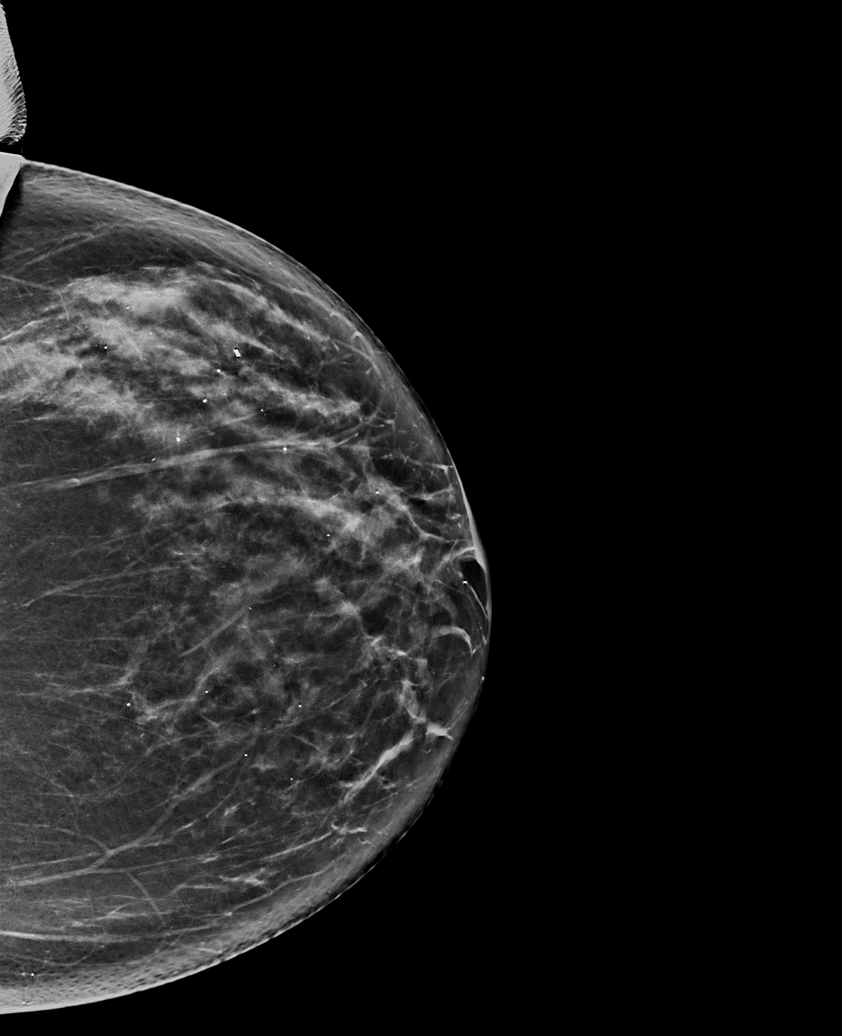

[L MLO synth-2D]
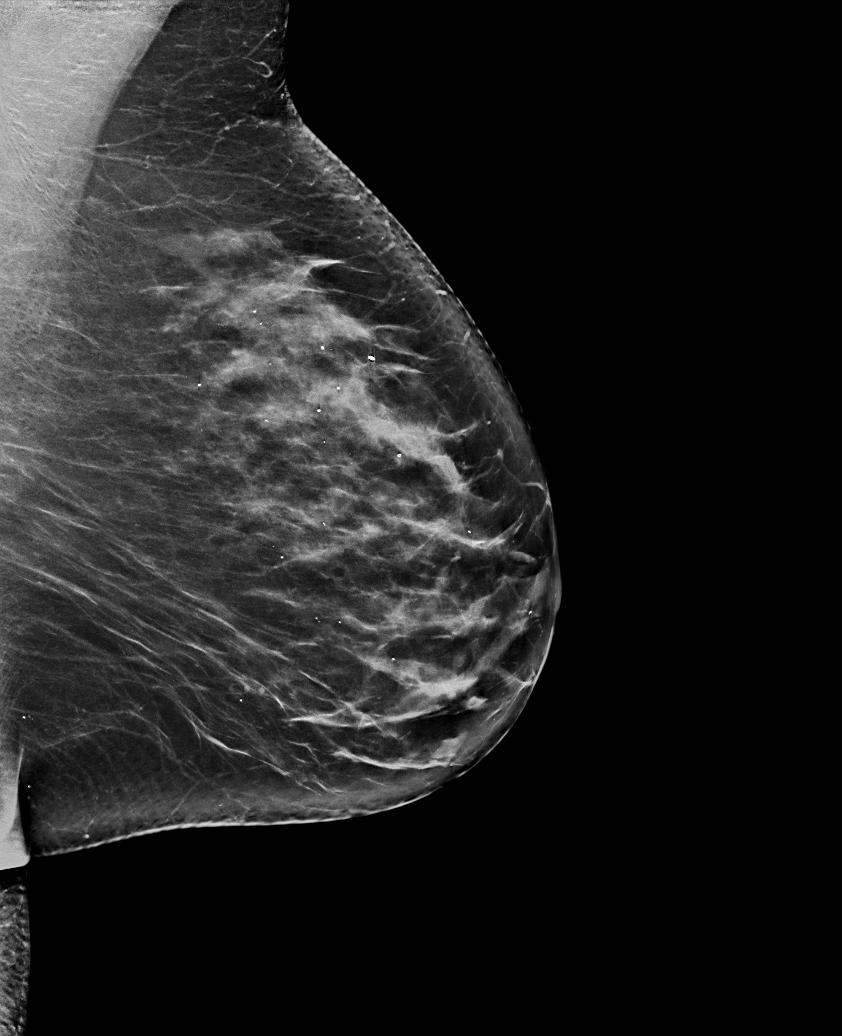

[L CC tomo (1 of 2) · tomo slice 35/70.0]
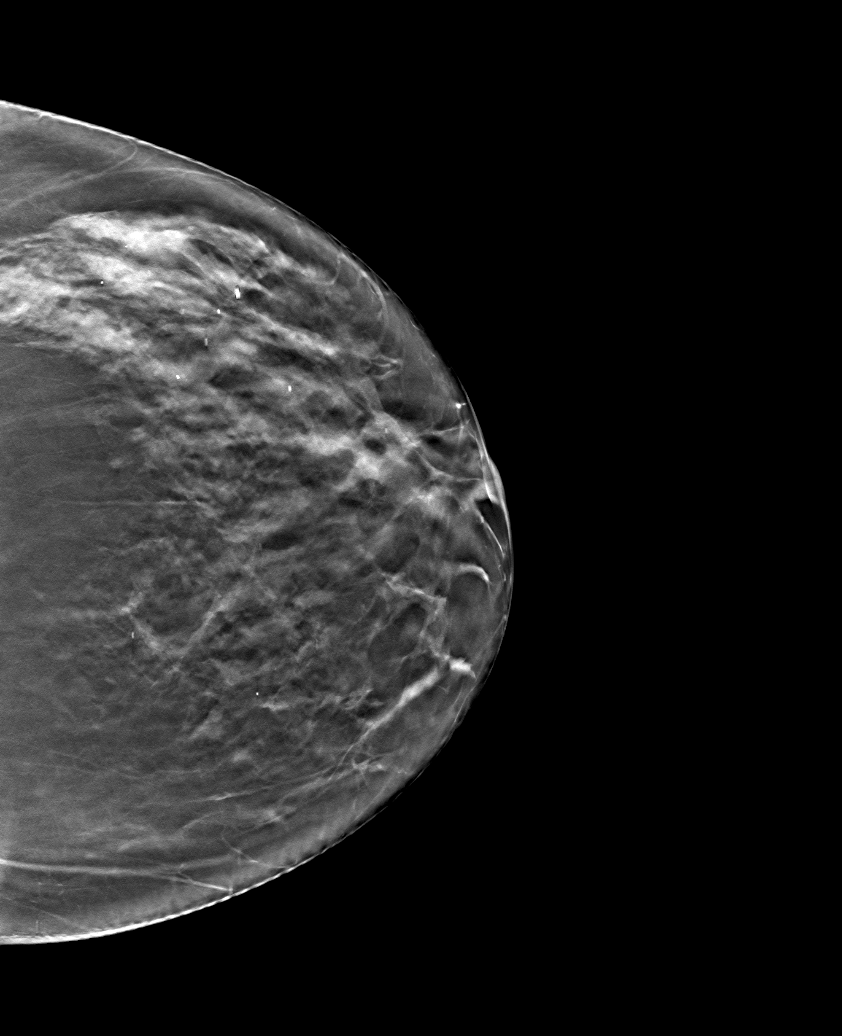

[L CC tomo (2 of 2) · tomo slice 35/70.0]
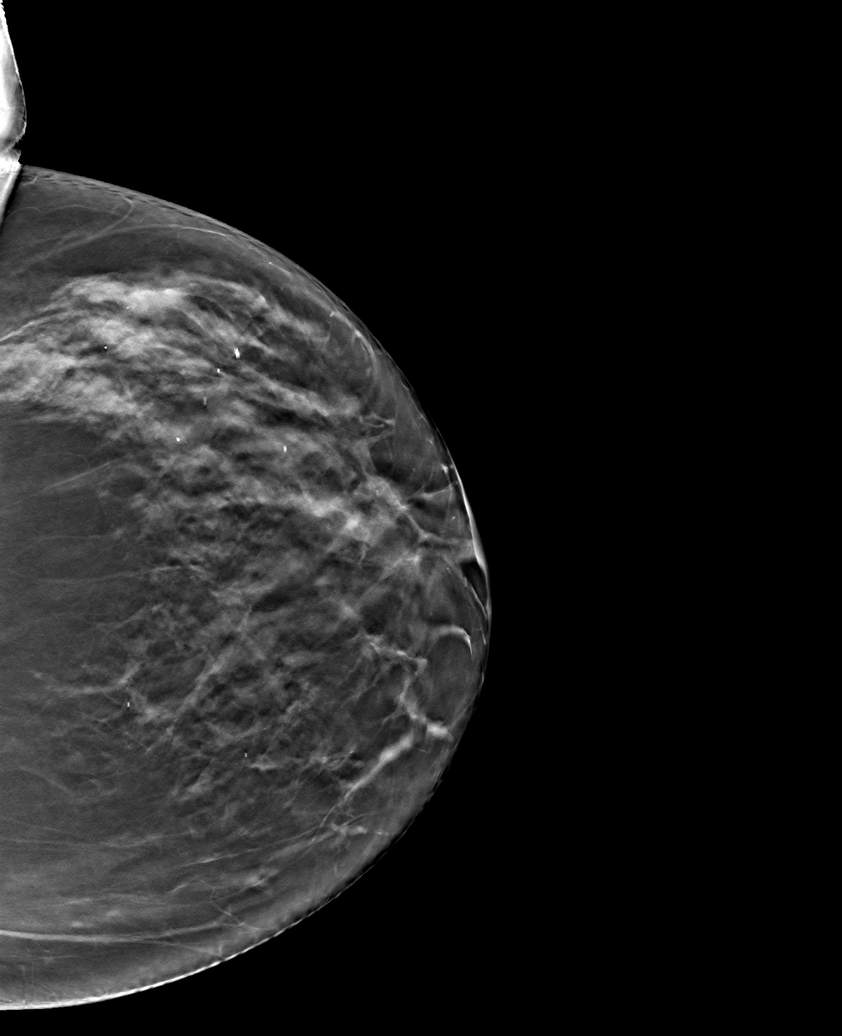

[L MLO tomo · tomo slice 43/85.0]
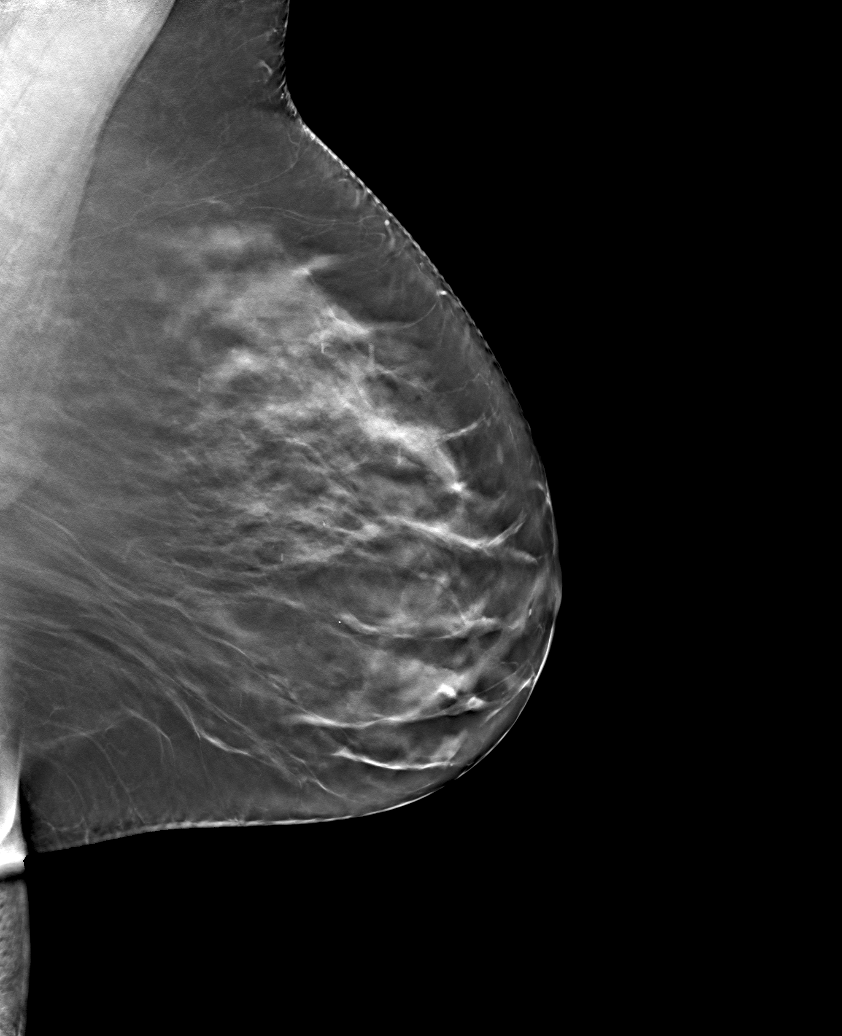

[6 of 18 positions shown; findings below may reference images not displayed]

ACR Breast Density Category c: The breast tissue is heterogeneously
dense, which may obscure small masses.
FINDINGS: There are no findings suspicious for malignancy. Status post RIGHT
mastectomy the images were evaluated with computer-aided detection.
IMPRESSION: No mammographic evidence of malignancy. A result letter of this
screening mammogram will be mailed directly to the patient.

RECOMMENDATION:
Screening mammogram in one year. (Code:MO-6-ONI)

BI-RADS CATEGORY  1: Negative.

## 2023-09-16 ENCOUNTER — Other Ambulatory Visit: Payer: Self-pay | Admitting: Family Medicine

## 2023-09-16 DIAGNOSIS — Z1231 Encounter for screening mammogram for malignant neoplasm of breast: Secondary | ICD-10-CM

## 2023-10-24 ENCOUNTER — Ambulatory Visit
Admission: RE | Admit: 2023-10-24 | Discharge: 2023-10-24 | Disposition: A | Discharge status: Home / Self Care | Source: Ambulatory Visit | Attending: Family Medicine | Admitting: Family Medicine

## 2023-10-24 DIAGNOSIS — Z1231 Encounter for screening mammogram for malignant neoplasm of breast: Secondary | ICD-10-CM | POA: Insufficient documentation

## 2024-01-23 ENCOUNTER — Other Ambulatory Visit: Payer: Self-pay | Admitting: Family Medicine

## 2024-01-23 DIAGNOSIS — Z780 Asymptomatic menopausal state: Secondary | ICD-10-CM

## 2024-03-04 ENCOUNTER — Ambulatory Visit
Admission: RE | Admit: 2024-03-04 | Discharge: 2024-03-04 | Disposition: A | Discharge status: Home / Self Care | Source: Ambulatory Visit | Attending: Family Medicine | Admitting: Family Medicine

## 2024-03-04 DIAGNOSIS — Z780 Asymptomatic menopausal state: Secondary | ICD-10-CM | POA: Diagnosis present
